# Patient Record
Sex: Female | Born: 1935 | Race: White | Hispanic: No | Smoking: Former smoker
Health system: Southern US, Community
[De-identification: ages and names within clinical notes are randomized; demographics above are authoritative.]

## PROBLEM LIST (undated history)

## (undated) DIAGNOSIS — H3530 Unspecified macular degeneration: Secondary | ICD-10-CM

## (undated) DIAGNOSIS — M109 Gout, unspecified: Secondary | ICD-10-CM

## (undated) DIAGNOSIS — F0390 Unspecified dementia without behavioral disturbance: Secondary | ICD-10-CM

## (undated) DIAGNOSIS — E785 Hyperlipidemia, unspecified: Secondary | ICD-10-CM

## (undated) DIAGNOSIS — K219 Gastro-esophageal reflux disease without esophagitis: Secondary | ICD-10-CM

## (undated) DIAGNOSIS — G4733 Obstructive sleep apnea (adult) (pediatric): Secondary | ICD-10-CM

## (undated) DIAGNOSIS — I252 Old myocardial infarction: Secondary | ICD-10-CM

## (undated) DIAGNOSIS — G510 Bell's palsy: Secondary | ICD-10-CM

## (undated) DIAGNOSIS — J449 Chronic obstructive pulmonary disease, unspecified: Secondary | ICD-10-CM

## (undated) DIAGNOSIS — F32A Depression, unspecified: Secondary | ICD-10-CM

## (undated) DIAGNOSIS — N189 Chronic kidney disease, unspecified: Secondary | ICD-10-CM

## (undated) DIAGNOSIS — I509 Heart failure, unspecified: Secondary | ICD-10-CM

## (undated) DIAGNOSIS — F329 Major depressive disorder, single episode, unspecified: Secondary | ICD-10-CM

## (undated) DIAGNOSIS — E119 Type 2 diabetes mellitus without complications: Secondary | ICD-10-CM

## (undated) DIAGNOSIS — E079 Disorder of thyroid, unspecified: Secondary | ICD-10-CM

---

## 2005-01-27 ENCOUNTER — Ambulatory Visit: Payer: Self-pay

## 2005-02-21 ENCOUNTER — Inpatient Hospital Stay: Payer: Self-pay | Admitting: Internal Medicine

## 2005-02-21 ENCOUNTER — Other Ambulatory Visit: Payer: Self-pay

## 2005-03-01 ENCOUNTER — Ambulatory Visit: Payer: Self-pay | Admitting: Internal Medicine

## 2005-03-08 ENCOUNTER — Ambulatory Visit: Payer: Self-pay | Admitting: Internal Medicine

## 2005-03-20 ENCOUNTER — Ambulatory Visit: Payer: Self-pay | Admitting: Internal Medicine

## 2005-04-19 ENCOUNTER — Ambulatory Visit: Payer: Self-pay | Admitting: Internal Medicine

## 2005-05-20 ENCOUNTER — Ambulatory Visit: Payer: Self-pay | Admitting: Internal Medicine

## 2006-12-11 ENCOUNTER — Inpatient Hospital Stay: Payer: Self-pay | Admitting: Internal Medicine

## 2006-12-11 ENCOUNTER — Other Ambulatory Visit: Payer: Self-pay

## 2006-12-23 ENCOUNTER — Other Ambulatory Visit: Payer: Self-pay

## 2006-12-23 ENCOUNTER — Emergency Department: Payer: Self-pay | Admitting: Emergency Medicine

## 2007-12-04 ENCOUNTER — Inpatient Hospital Stay: Payer: Self-pay | Admitting: Internal Medicine

## 2007-12-04 ENCOUNTER — Other Ambulatory Visit: Payer: Self-pay

## 2008-12-17 IMAGING — US US RENAL KIDNEY
1 series · 17 of 25 positions shown · non-contrast
Comparison: none

REASON FOR EXAM: Worsening renal insufficiency
COMMENTS:

[Series 1: us renal kidney · 17 of 33 slices shown]
[im 1/33]
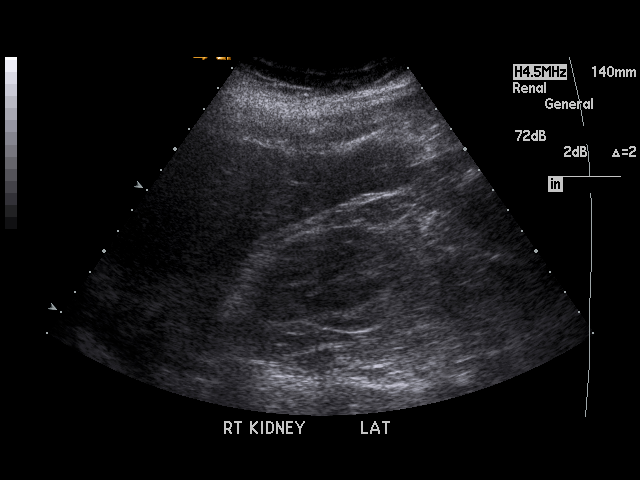
[im 3/33]
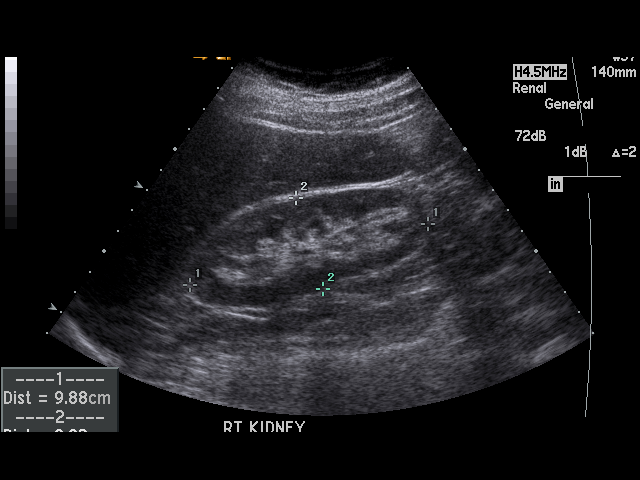
[im 5/33]
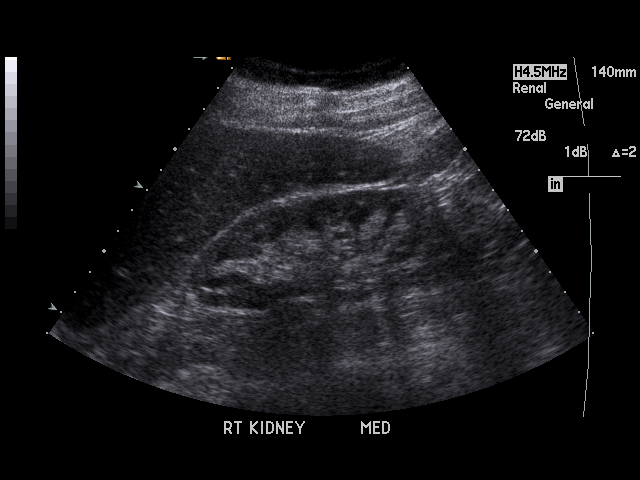
[im 7/33]
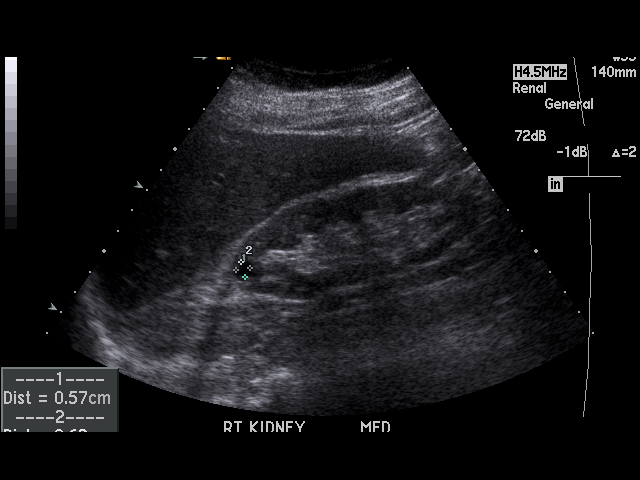
[im 9/33]
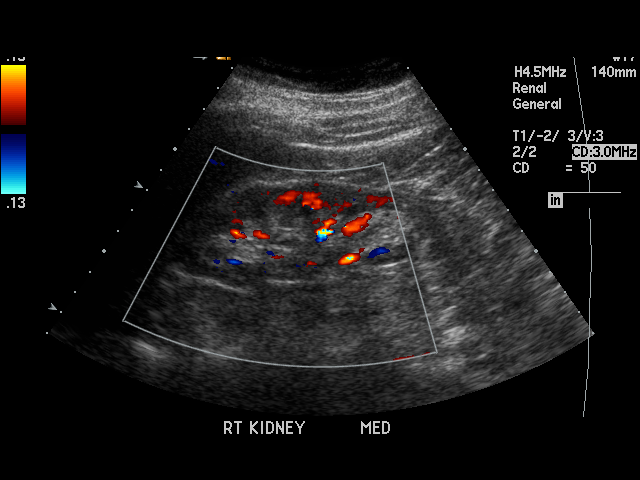
[im 11/33]
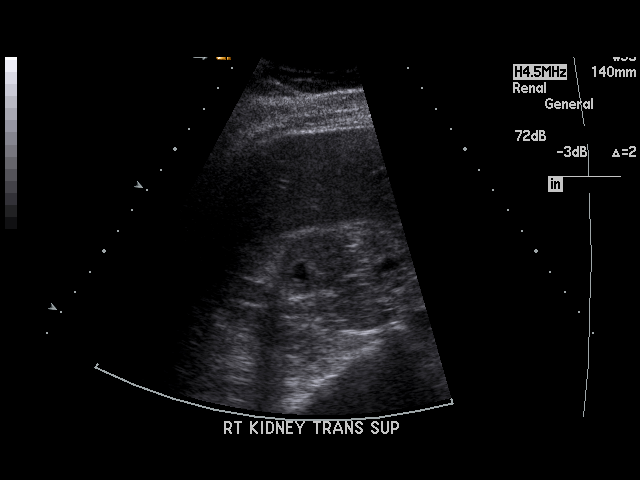
[im 13/33]
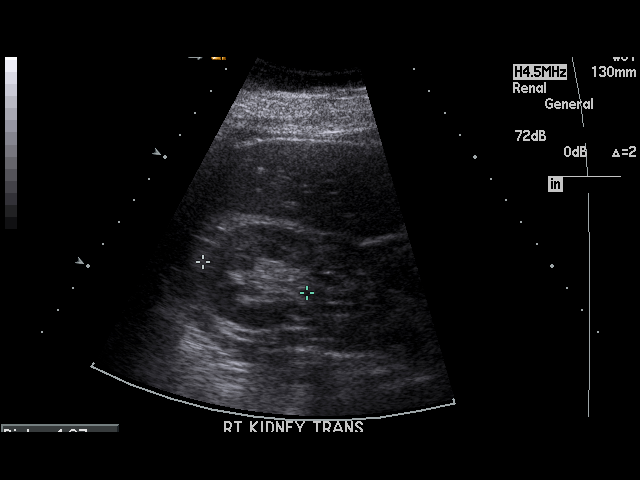
[im 15/33]
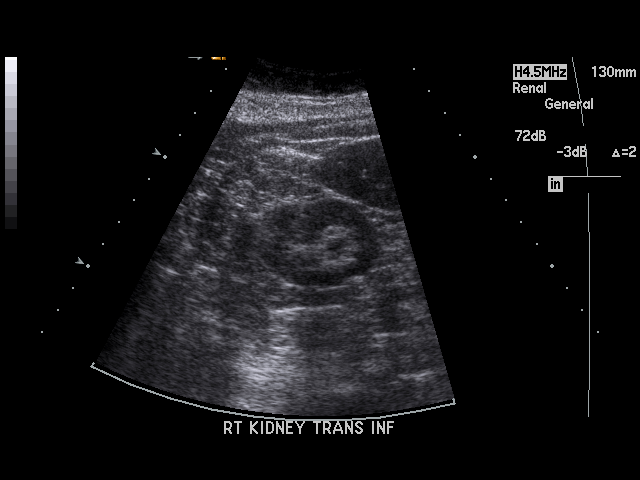
[im 17/33]
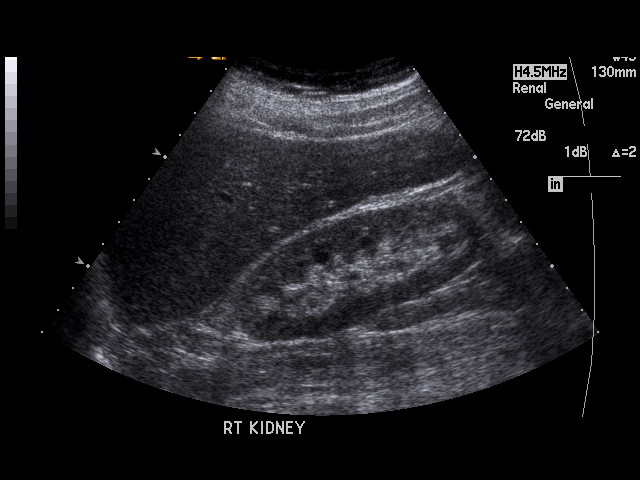
[im 18/33]
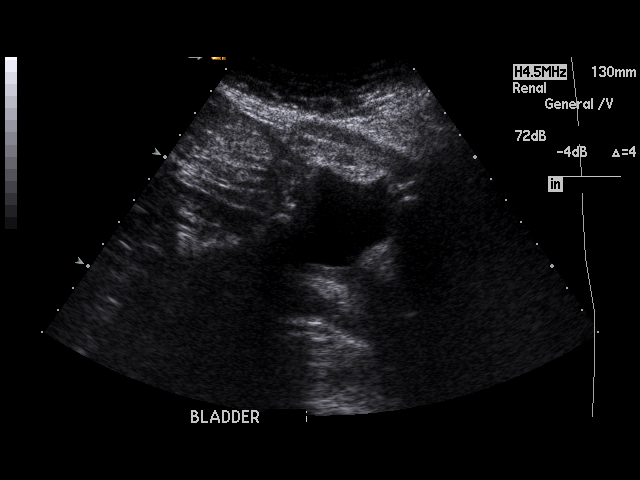
[im 21/33]
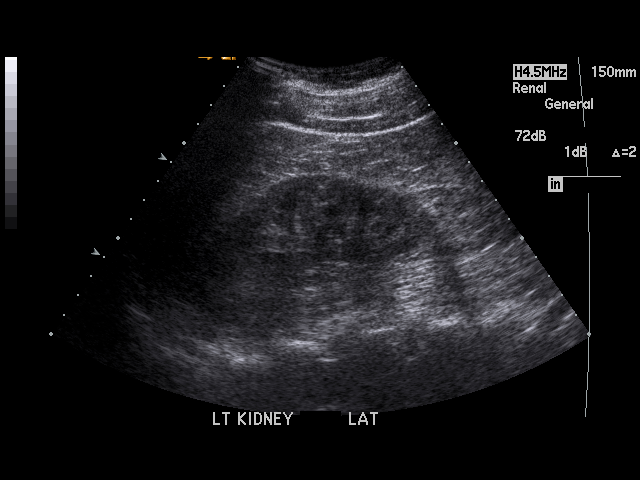
[im 22/33]
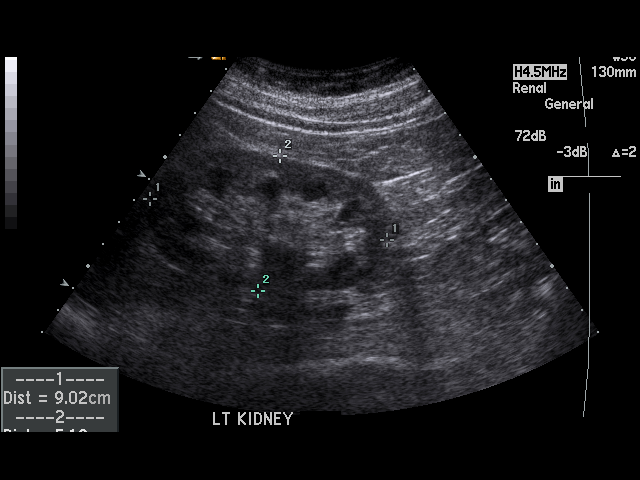
[im 25/33]
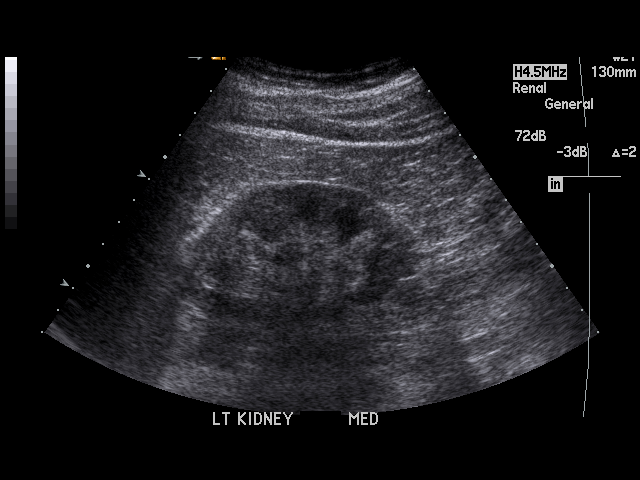
[im 26/33]
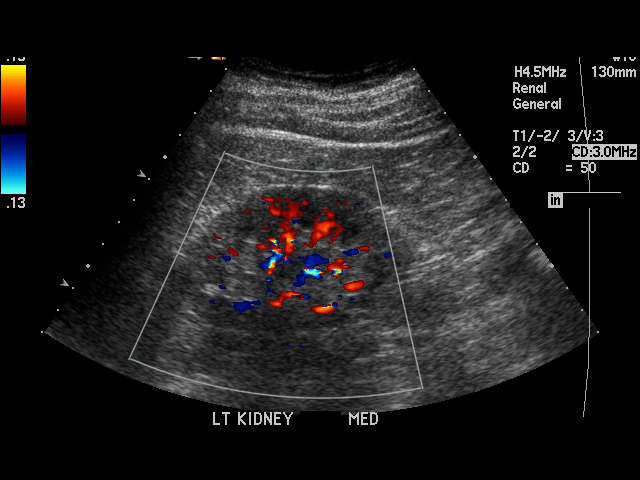
[im 29/33]
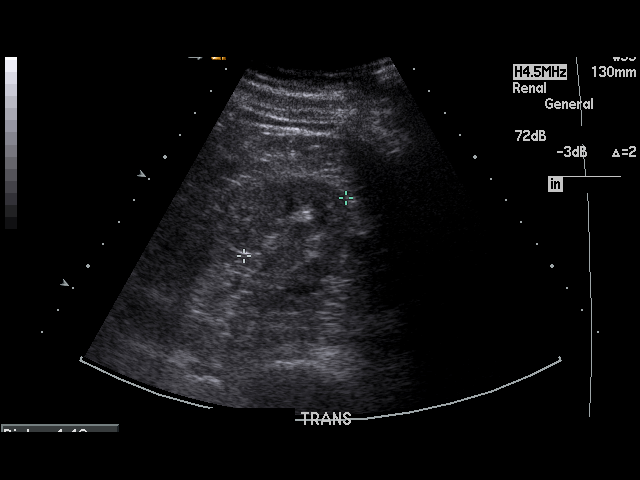
[im 30/33]
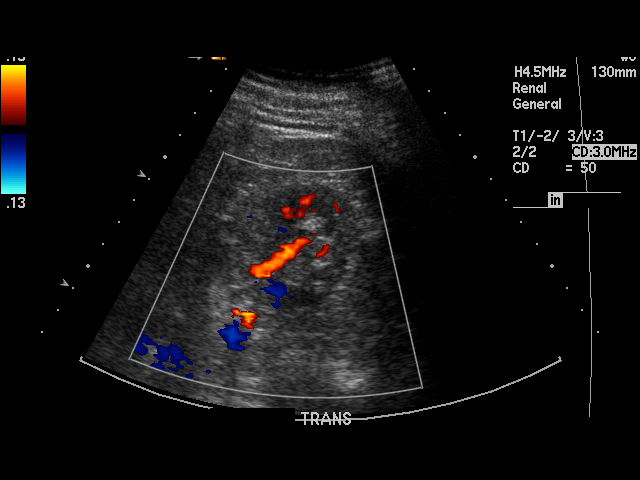
[im 33/33]
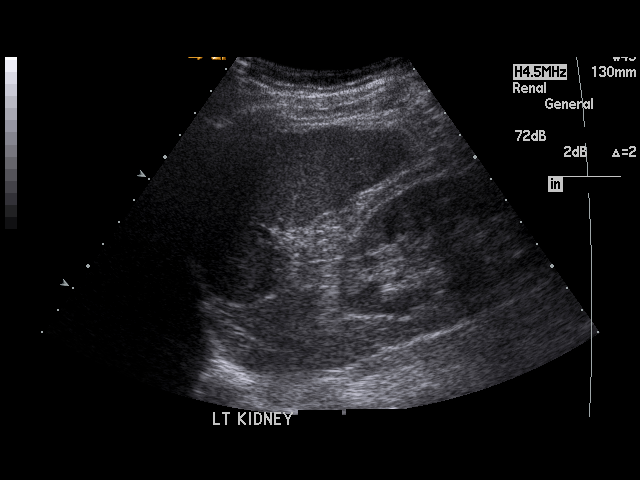

[17 of 25 positions shown; findings below may reference images not displayed]

PROCEDURE:     US  - US KIDNEY BILATERAL  - December 12, 2006  [DATE]

RESULT:     The RIGHT kidney measures 9.88 cm x 3.82 cm x 4.07 cm and the
LEFT kidney measures 9.02 cm x 5.13 cm x 4.40 cm.  There is a 6.2 mm cyst at
the upper pole of the RIGHT kidney.  No other solid or cystic renal mass
lesions are seen.  The renal cortical margins are smooth.  No renal
calcifications are identified.  There is no hydronephrosis.  The kidneys
show increased echogenicity compatible with a clinical history of renal
insufficiency.
IMPRESSION: No hydronephrosis or other acute changes are identified.

The kidneys show increased echogenicity compatible with a clinical history
of renal insufficiency.

## 2009-08-10 ENCOUNTER — Inpatient Hospital Stay: Payer: Self-pay | Admitting: Internal Medicine

## 2009-08-15 ENCOUNTER — Encounter: Payer: Self-pay | Admitting: Internal Medicine

## 2009-08-20 ENCOUNTER — Encounter: Payer: Self-pay | Admitting: Internal Medicine

## 2009-09-09 ENCOUNTER — Ambulatory Visit: Payer: Self-pay | Admitting: Internal Medicine

## 2009-09-19 ENCOUNTER — Encounter: Payer: Self-pay | Admitting: Internal Medicine

## 2010-12-29 ENCOUNTER — Emergency Department: Payer: Self-pay | Admitting: Emergency Medicine

## 2010-12-30 ENCOUNTER — Inpatient Hospital Stay: Payer: Self-pay | Admitting: Internal Medicine

## 2011-01-03 ENCOUNTER — Encounter: Payer: Self-pay | Admitting: Internal Medicine

## 2011-01-20 ENCOUNTER — Encounter: Payer: Self-pay | Admitting: Internal Medicine

## 2011-02-19 ENCOUNTER — Ambulatory Visit: Payer: Self-pay | Admitting: Internal Medicine

## 2011-02-20 ENCOUNTER — Encounter: Payer: Self-pay | Admitting: Internal Medicine

## 2011-03-16 ENCOUNTER — Emergency Department: Payer: Self-pay | Admitting: Emergency Medicine

## 2013-01-20 ENCOUNTER — Ambulatory Visit: Payer: Self-pay | Admitting: Internal Medicine

## 2013-01-24 ENCOUNTER — Inpatient Hospital Stay: Payer: Self-pay | Admitting: Internal Medicine

## 2013-01-24 LAB — IRON AND TIBC
Iron Bind.Cap.(Total): 463 ug/dL — ABNORMAL HIGH (ref 250–450)
Iron Saturation: 9 %
Iron: 43 ug/dL — ABNORMAL LOW (ref 50–170)
Unbound Iron-Bind.Cap.: 420 ug/dL

## 2013-01-24 LAB — CBC
HGB: 8.9 g/dL — ABNORMAL LOW (ref 12.0–16.0)
MCV: 75 fL — ABNORMAL LOW (ref 80–100)
Platelet: 342 10*3/uL (ref 150–440)
RBC: 3.76 10*6/uL — ABNORMAL LOW (ref 3.80–5.20)
RDW: 18.4 % — ABNORMAL HIGH (ref 11.5–14.5)

## 2013-01-24 LAB — FERRITIN: Ferritin (ARMC): 10 ng/mL (ref 8–388)

## 2013-01-24 LAB — BASIC METABOLIC PANEL
BUN: 58 mg/dL — ABNORMAL HIGH (ref 7–18)
Calcium, Total: 8.7 mg/dL (ref 8.5–10.1)
Chloride: 107 mmol/L (ref 98–107)
EGFR (Non-African Amer.): 18 — ABNORMAL LOW
Osmolality: 296 (ref 275–301)
Potassium: 4.3 mmol/L (ref 3.5–5.1)
Sodium: 141 mmol/L (ref 136–145)

## 2013-01-24 LAB — URINALYSIS, COMPLETE
Bilirubin,UR: NEGATIVE
Blood: NEGATIVE
Glucose,UR: NEGATIVE mg/dL (ref 0–75)
Hyaline Cast: 4
Ketone: NEGATIVE
Leukocyte Esterase: NEGATIVE
Nitrite: NEGATIVE
Ph: 5 (ref 4.5–8.0)
Protein: NEGATIVE
RBC,UR: <1 /HPF (ref 0–5)
WBC UR: 1 /HPF (ref 0–5)

## 2013-01-24 LAB — PRO B NATRIURETIC PEPTIDE: B-Type Natriuretic Peptide: 2248 pg/mL — ABNORMAL HIGH (ref 0–450)

## 2013-01-25 LAB — CBC WITH DIFFERENTIAL/PLATELET
Basophil #: 0.1 10*3/uL (ref 0.0–0.1)
Basophil %: 0.7 %
Eosinophil #: 0.1 10*3/uL (ref 0.0–0.7)
Eosinophil %: 0.9 %
HGB: 8.4 g/dL — ABNORMAL LOW (ref 12.0–16.0)
MCV: 75 fL — ABNORMAL LOW (ref 80–100)
Monocyte #: 0.8 x10 3/mm (ref 0.2–0.9)
Neutrophil #: 7.4 10*3/uL — ABNORMAL HIGH (ref 1.4–6.5)
Neutrophil %: 80.3 %
Platelet: 291 10*3/uL (ref 150–440)
RDW: 18.3 % — ABNORMAL HIGH (ref 11.5–14.5)

## 2013-01-25 LAB — TROPONIN I
Troponin-I: 0.06 ng/mL — ABNORMAL HIGH
Troponin-I: 0.06 ng/mL — ABNORMAL HIGH

## 2013-01-25 LAB — CK TOTAL AND CKMB (NOT AT ARMC): CK, Total: 318 U/L — ABNORMAL HIGH (ref 21–215)

## 2013-01-25 LAB — MAGNESIUM: Magnesium: 2.1 mg/dL

## 2013-01-26 LAB — CBC WITH DIFFERENTIAL/PLATELET
Basophil #: 0.1 10*3/uL (ref 0.0–0.1)
HCT: 29.4 % — ABNORMAL LOW (ref 35.0–47.0)
HGB: 9.5 g/dL — ABNORMAL LOW (ref 12.0–16.0)
Lymphocyte #: 1.9 10*3/uL (ref 1.0–3.6)
Lymphocyte %: 22.8 %
MCHC: 32.2 g/dL (ref 32.0–36.0)
Monocyte #: 0.9 x10 3/mm (ref 0.2–0.9)
Neutrophil #: 5.1 10*3/uL (ref 1.4–6.5)
Neutrophil %: 61.3 %
RBC: 3.86 10*6/uL (ref 3.80–5.20)

## 2013-01-26 LAB — BASIC METABOLIC PANEL
Anion Gap: 6 — ABNORMAL LOW (ref 7–16)
Calcium, Total: 8.6 mg/dL (ref 8.5–10.1)
Chloride: 109 mmol/L — ABNORMAL HIGH (ref 98–107)
Co2: 27 mmol/L (ref 21–32)
EGFR (Non-African Amer.): 19 — ABNORMAL LOW
Osmolality: 296 (ref 275–301)
Sodium: 142 mmol/L (ref 136–145)

## 2013-01-26 LAB — TROPONIN I: Troponin-I: 0.08 ng/mL — ABNORMAL HIGH

## 2013-01-27 LAB — BASIC METABOLIC PANEL
Anion Gap: 7 (ref 7–16)
BUN: 46 mg/dL — ABNORMAL HIGH (ref 7–18)
Calcium, Total: 8.4 mg/dL — ABNORMAL LOW (ref 8.5–10.1)
Co2: 28 mmol/L (ref 21–32)
EGFR (Non-African Amer.): 20 — ABNORMAL LOW
Glucose: 129 mg/dL — ABNORMAL HIGH (ref 65–99)
Potassium: 4.7 mmol/L (ref 3.5–5.1)

## 2013-01-27 LAB — URIC ACID: Uric Acid: 9.7 mg/dL — ABNORMAL HIGH (ref 2.6–6.0)

## 2013-01-28 LAB — CBC WITH DIFFERENTIAL/PLATELET
Basophil #: 0 10*3/uL (ref 0.0–0.1)
Basophil %: 0.4 %
Eosinophil #: 0 10*3/uL (ref 0.0–0.7)
Eosinophil %: 0.3 %
MCH: 24.7 pg — ABNORMAL LOW (ref 26.0–34.0)
MCHC: 32.3 g/dL (ref 32.0–36.0)
MCV: 77 fL — ABNORMAL LOW (ref 80–100)
Monocyte #: 0.8 x10 3/mm (ref 0.2–0.9)
Neutrophil #: 8.1 10*3/uL — ABNORMAL HIGH (ref 1.4–6.5)
RBC: 3.61 10*6/uL — ABNORMAL LOW (ref 3.80–5.20)
RDW: 17.9 % — ABNORMAL HIGH (ref 11.5–14.5)
WBC: 10.2 10*3/uL (ref 3.6–11.0)

## 2013-01-29 LAB — BASIC METABOLIC PANEL
Anion Gap: 8 (ref 7–16)
BUN: 54 mg/dL — ABNORMAL HIGH (ref 7–18)
Calcium, Total: 8.5 mg/dL (ref 8.5–10.1)
Chloride: 105 mmol/L (ref 98–107)
Creatinine: 2.29 mg/dL — ABNORMAL HIGH (ref 0.60–1.30)
EGFR (African American): 21 — ABNORMAL LOW
EGFR (Non-African Amer.): 18 — ABNORMAL LOW
Glucose: 99 mg/dL (ref 65–99)
Osmolality: 294 (ref 275–301)
Potassium: 4.2 mmol/L (ref 3.5–5.1)
Sodium: 140 mmol/L (ref 136–145)

## 2013-01-29 LAB — HEMATOCRIT: HCT: 27.3 % — ABNORMAL LOW (ref 35.0–47.0)

## 2013-01-30 LAB — HEMATOCRIT: HCT: 27.7 % — ABNORMAL LOW (ref 35.0–47.0)

## 2013-01-30 LAB — PATHOLOGY REPORT

## 2013-01-31 ENCOUNTER — Encounter: Payer: Self-pay | Admitting: Internal Medicine

## 2013-02-17 ENCOUNTER — Encounter: Payer: Self-pay | Admitting: Internal Medicine

## 2013-02-17 ENCOUNTER — Ambulatory Visit: Payer: Self-pay | Admitting: Internal Medicine

## 2013-02-21 LAB — CLOSTRIDIUM DIFFICILE BY PCR

## 2013-04-12 LAB — CBC
HCT: 35.3 % (ref 35.0–47.0)
HGB: 11.2 g/dL — ABNORMAL LOW (ref 12.0–16.0)
MCH: 26.4 pg (ref 26.0–34.0)
Platelet: 309 10*3/uL (ref 150–440)
RBC: 4.25 10*6/uL (ref 3.80–5.20)
RDW: 22.1 % — ABNORMAL HIGH (ref 11.5–14.5)
WBC: 9.9 10*3/uL (ref 3.6–11.0)

## 2013-04-12 LAB — BASIC METABOLIC PANEL
Anion Gap: 5 — ABNORMAL LOW (ref 7–16)
BUN: 39 mg/dL — ABNORMAL HIGH (ref 7–18)
EGFR (Non-African Amer.): 25 — ABNORMAL LOW
Glucose: 147 mg/dL — ABNORMAL HIGH (ref 65–99)
Sodium: 140 mmol/L (ref 136–145)

## 2013-04-12 LAB — TROPONIN I: Troponin-I: <0.02 ng/mL

## 2013-04-13 ENCOUNTER — Inpatient Hospital Stay: Payer: Self-pay | Admitting: Internal Medicine

## 2013-04-13 LAB — URIC ACID: Uric Acid: 7 mg/dL — ABNORMAL HIGH (ref 2.6–6.0)

## 2013-04-14 LAB — BASIC METABOLIC PANEL
BUN: 37 mg/dL — ABNORMAL HIGH (ref 7–18)
Calcium, Total: 7.9 mg/dL — ABNORMAL LOW (ref 8.5–10.1)
Chloride: 104 mmol/L (ref 98–107)
Creatinine: 2.13 mg/dL — ABNORMAL HIGH (ref 0.60–1.30)
EGFR (African American): 23 — ABNORMAL LOW
Glucose: 104 mg/dL — ABNORMAL HIGH (ref 65–99)
Osmolality: 294 (ref 275–301)
Potassium: 3.8 mmol/L (ref 3.5–5.1)
Sodium: 143 mmol/L (ref 136–145)

## 2013-04-16 LAB — CBC WITH DIFFERENTIAL/PLATELET
Basophil #: 0 x10 3/mm 3
Basophil %: 0.1 %
Eosinophil #: 0 x10 3/mm 3
Eosinophil %: 0 %
HCT: 32.5 % — ABNORMAL LOW
HGB: 10.5 g/dL — ABNORMAL LOW
Lymphocyte %: 3.6 %
Lymphs Abs: 0.5 x10 3/mm 3 — ABNORMAL LOW
MCH: 26.3 pg
MCHC: 32.3 g/dL
MCV: 81 fL
Monocyte #: 0.3 "x10 3/mm "
Monocyte %: 2.1 %
Neutrophil #: 12.1 x10 3/mm 3 — ABNORMAL HIGH
Neutrophil %: 94.2 %
RBC: 3.99 X10 6/mm 3
RDW: 21.9 % — ABNORMAL HIGH
WBC: 12.8 x10 3/mm 3 — ABNORMAL HIGH

## 2013-04-16 LAB — BASIC METABOLIC PANEL
BUN: 60 mg/dL — ABNORMAL HIGH (ref 7–18)
Calcium, Total: 8.5 mg/dL (ref 8.5–10.1)
Chloride: 101 mmol/L (ref 98–107)
Creatinine: 2.03 mg/dL — ABNORMAL HIGH (ref 0.60–1.30)
EGFR (African American): 25 — ABNORMAL LOW
EGFR (Non-African Amer.): 21 — ABNORMAL LOW
Potassium: 4.3 mmol/L (ref 3.5–5.1)

## 2013-04-16 LAB — PLATELET COUNT: Platelet: 315 x10 3/mm 3

## 2013-04-17 LAB — BASIC METABOLIC PANEL
Calcium, Total: 8.4 mg/dL — ABNORMAL LOW (ref 8.5–10.1)
Creatinine: 2.28 mg/dL — ABNORMAL HIGH (ref 0.60–1.30)
EGFR (Non-African Amer.): 18 — ABNORMAL LOW
Glucose: 134 mg/dL — ABNORMAL HIGH (ref 65–99)
Osmolality: 307 (ref 275–301)
Potassium: 3.8 mmol/L (ref 3.5–5.1)

## 2013-04-17 LAB — CBC WITH DIFFERENTIAL/PLATELET
Basophil #: 0 10*3/uL (ref 0.0–0.1)
Basophil %: 0.1 %
Eosinophil #: 0 10*3/uL (ref 0.0–0.7)
HCT: 33.6 % — ABNORMAL LOW (ref 35.0–47.0)
HGB: 10.9 g/dL — ABNORMAL LOW (ref 12.0–16.0)
Lymphocyte #: 0.9 10*3/uL — ABNORMAL LOW (ref 1.0–3.6)
Lymphocyte %: 6.7 %
MCH: 26.7 pg (ref 26.0–34.0)
MCHC: 32.5 g/dL (ref 32.0–36.0)
MCV: 82 fL (ref 80–100)
Monocyte %: 5.7 %
Neutrophil %: 87.4 %
Platelet: 308 10*3/uL (ref 150–440)
RDW: 21.4 % — ABNORMAL HIGH (ref 11.5–14.5)
WBC: 13.3 10*3/uL — ABNORMAL HIGH (ref 3.6–11.0)

## 2013-04-18 ENCOUNTER — Encounter: Payer: Self-pay | Admitting: Internal Medicine

## 2013-04-18 LAB — BASIC METABOLIC PANEL
Anion Gap: 4 — ABNORMAL LOW (ref 7–16)
BUN: 66 mg/dL — ABNORMAL HIGH (ref 7–18)
Calcium, Total: 8.1 mg/dL — ABNORMAL LOW (ref 8.5–10.1)
Chloride: 106 mmol/L (ref 98–107)
Creatinine: 2.13 mg/dL — ABNORMAL HIGH (ref 0.60–1.30)
EGFR (Non-African Amer.): 20 — ABNORMAL LOW
Glucose: 208 mg/dL — ABNORMAL HIGH (ref 65–99)
Osmolality: 310 (ref 275–301)
Sodium: 143 mmol/L (ref 136–145)

## 2013-04-19 ENCOUNTER — Encounter: Payer: Self-pay | Admitting: Internal Medicine

## 2013-04-26 LAB — BASIC METABOLIC PANEL
Calcium, Total: 8.3 mg/dL — ABNORMAL LOW (ref 8.5–10.1)
Creatinine: 1.82 mg/dL — ABNORMAL HIGH (ref 0.60–1.30)
Potassium: 4.4 mmol/L (ref 3.5–5.1)

## 2013-04-30 LAB — BASIC METABOLIC PANEL
Co2: 28 mmol/L (ref 21–32)
Creatinine: 2.64 mg/dL — ABNORMAL HIGH (ref 0.60–1.30)
EGFR (African American): 18 — ABNORMAL LOW
EGFR (Non-African Amer.): 15 — ABNORMAL LOW
Glucose: 170 mg/dL — ABNORMAL HIGH (ref 65–99)
Osmolality: 295 (ref 275–301)
Sodium: 137 mmol/L (ref 136–145)

## 2013-05-10 LAB — BASIC METABOLIC PANEL
Calcium, Total: 9.1 mg/dL (ref 8.5–10.1)
EGFR (African American): 25 — ABNORMAL LOW
Glucose: 105 mg/dL — ABNORMAL HIGH (ref 65–99)
Osmolality: 293 (ref 275–301)
Potassium: 4.2 mmol/L (ref 3.5–5.1)

## 2013-05-20 ENCOUNTER — Encounter: Payer: Self-pay | Admitting: Internal Medicine

## 2013-06-19 ENCOUNTER — Encounter: Payer: Self-pay | Admitting: Internal Medicine

## 2013-06-19 ENCOUNTER — Ambulatory Visit: Payer: Self-pay | Admitting: Internal Medicine

## 2013-07-10 ENCOUNTER — Observation Stay: Payer: Self-pay | Admitting: Internal Medicine

## 2013-07-10 LAB — TROPONIN I
Troponin-I: 0.05 ng/mL
Troponin-I: 6.1 ng/mL — ABNORMAL HIGH

## 2013-07-10 LAB — CBC WITH DIFFERENTIAL/PLATELET
Basophil %: 1.1 %
Eosinophil %: 2.6 %
HGB: 10.8 g/dL — ABNORMAL LOW (ref 12.0–16.0)
MCV: 88 fL (ref 80–100)
RBC: 3.75 10*6/uL — ABNORMAL LOW (ref 3.80–5.20)
RDW: 18.4 % — ABNORMAL HIGH (ref 11.5–14.5)
WBC: 12.2 10*3/uL — ABNORMAL HIGH (ref 3.6–11.0)

## 2013-07-10 LAB — URINALYSIS, COMPLETE
Bilirubin,UR: NEGATIVE
Hyaline Cast: 3
Protein: NEGATIVE
Specific Gravity: 1.01 (ref 1.003–1.030)
Squamous Epithelial: 3

## 2013-07-10 LAB — CK TOTAL AND CKMB (NOT AT ARMC)
CK, Total: 175 U/L (ref 21–215)
CK, Total: 172 U/L (ref 21–215)
CK-MB: 1.9 ng/mL (ref 0.5–3.6)

## 2013-07-10 LAB — COMPREHENSIVE METABOLIC PANEL
Albumin: 3.6 g/dL (ref 3.4–5.0)
BUN: 58 mg/dL — ABNORMAL HIGH (ref 7–18)
Bilirubin,Total: 0.2 mg/dL (ref 0.2–1.0)
Calcium, Total: 9.1 mg/dL (ref 8.5–10.1)
Chloride: 102 mmol/L (ref 98–107)
Creatinine: 2.47 mg/dL — ABNORMAL HIGH (ref 0.60–1.30)
EGFR (African American): 19 — ABNORMAL LOW
Glucose: 274 mg/dL — ABNORMAL HIGH (ref 65–99)
Osmolality: 300 (ref 275–301)
SGOT(AST): 19 U/L (ref 15–37)
SGPT (ALT): 16 U/L (ref 12–78)
Sodium: 137 mmol/L (ref 136–145)

## 2013-07-11 LAB — BASIC METABOLIC PANEL
Anion Gap: 1 — ABNORMAL LOW (ref 7–16)
BUN: 50 mg/dL — ABNORMAL HIGH (ref 7–18)
Calcium, Total: 8.8 mg/dL (ref 8.5–10.1)
Chloride: 107 mmol/L (ref 98–107)
Co2: 33 mmol/L — ABNORMAL HIGH (ref 21–32)
Creatinine: 1.83 mg/dL — ABNORMAL HIGH (ref 0.60–1.30)
EGFR (African American): 28 — ABNORMAL LOW
EGFR (Non-African Amer.): 24 — ABNORMAL LOW
Glucose: 103 mg/dL — ABNORMAL HIGH (ref 65–99)
Osmolality: 295 (ref 275–301)
Sodium: 141 mmol/L (ref 136–145)

## 2013-07-11 LAB — CBC WITH DIFFERENTIAL/PLATELET
Basophil #: 0.1 10*3/uL (ref 0.0–0.1)
Basophil %: 1.2 %
Eosinophil #: 0.5 10*3/uL (ref 0.0–0.7)
MCHC: 33 g/dL (ref 32.0–36.0)
MCV: 88 fL (ref 80–100)
Monocyte %: 10.9 %
Neutrophil #: 5.8 10*3/uL (ref 1.4–6.5)
Platelet: 226 10*3/uL (ref 150–440)
RBC: 3.5 10*6/uL — ABNORMAL LOW (ref 3.80–5.20)

## 2013-07-11 LAB — LIPID PANEL
Cholesterol: 145 mg/dL (ref 0–200)
HDL Cholesterol: 67 mg/dL — ABNORMAL HIGH (ref 40–60)
VLDL Cholesterol, Calc: 13 mg/dL (ref 5–40)

## 2013-07-20 ENCOUNTER — Encounter: Payer: Self-pay | Admitting: Internal Medicine

## 2013-07-20 ENCOUNTER — Ambulatory Visit: Payer: Self-pay | Admitting: Internal Medicine

## 2013-08-20 ENCOUNTER — Encounter: Payer: Self-pay | Admitting: Internal Medicine

## 2013-09-04 LAB — BASIC METABOLIC PANEL
Anion Gap: 5 — ABNORMAL LOW (ref 7–16)
Calcium, Total: 8.3 mg/dL — ABNORMAL LOW (ref 8.5–10.1)
Chloride: 105 mmol/L (ref 98–107)
Co2: 32 mmol/L (ref 21–32)
Creatinine: 1.97 mg/dL — ABNORMAL HIGH (ref 0.60–1.30)
EGFR (Non-African Amer.): 22 — ABNORMAL LOW
Potassium: 4.3 mmol/L (ref 3.5–5.1)
Sodium: 142 mmol/L (ref 136–145)

## 2013-09-19 ENCOUNTER — Encounter: Payer: Self-pay | Admitting: Internal Medicine

## 2013-09-20 LAB — BASIC METABOLIC PANEL
Anion Gap: 3 — ABNORMAL LOW (ref 7–16)
BUN: 58 mg/dL — ABNORMAL HIGH (ref 7–18)
Calcium, Total: 9.2 mg/dL (ref 8.5–10.1)
Co2: 36 mmol/L — ABNORMAL HIGH (ref 21–32)
Creatinine: 2.34 mg/dL — ABNORMAL HIGH (ref 0.60–1.30)
EGFR (Non-African Amer.): 18 — ABNORMAL LOW
Osmolality: 294 (ref 275–301)
Sodium: 139 mmol/L (ref 136–145)

## 2013-09-25 LAB — BASIC METABOLIC PANEL
Calcium, Total: 9.3 mg/dL (ref 8.5–10.1)
Chloride: 102 mmol/L (ref 98–107)
Co2: 33 mmol/L — ABNORMAL HIGH (ref 21–32)
EGFR (African American): 17 — ABNORMAL LOW
EGFR (Non-African Amer.): 15 — ABNORMAL LOW
Glucose: 112 mg/dL — ABNORMAL HIGH (ref 65–99)
Sodium: 138 mmol/L (ref 136–145)

## 2013-10-20 ENCOUNTER — Encounter: Payer: Self-pay | Admitting: Internal Medicine

## 2013-11-19 ENCOUNTER — Encounter: Payer: Self-pay | Admitting: Internal Medicine

## 2013-12-20 ENCOUNTER — Encounter: Payer: Self-pay | Admitting: Internal Medicine

## 2014-01-20 ENCOUNTER — Encounter: Payer: Self-pay | Admitting: Internal Medicine

## 2014-01-29 LAB — TSH: THYROID STIMULATING HORM: 3.53 u[IU]/mL

## 2014-01-29 LAB — BASIC METABOLIC PANEL
Anion Gap: 3 — ABNORMAL LOW (ref 7–16)
BUN: 85 mg/dL — ABNORMAL HIGH (ref 7–18)
CO2: 28 mmol/L (ref 21–32)
Calcium, Total: 9.4 mg/dL (ref 8.5–10.1)
Chloride: 107 mmol/L (ref 98–107)
Creatinine: 2.48 mg/dL — ABNORMAL HIGH (ref 0.60–1.30)
GFR CALC AF AMER: 19 — AB
GFR CALC NON AF AMER: 17 — AB
Glucose: 165 mg/dL — ABNORMAL HIGH (ref 65–99)
OSMOLALITY: 305 (ref 275–301)
POTASSIUM: 4.6 mmol/L (ref 3.5–5.1)
Sodium: 138 mmol/L (ref 136–145)

## 2014-01-29 LAB — CBC WITH DIFFERENTIAL/PLATELET
BASOS PCT: 1 %
Basophil #: 0.1 10*3/uL (ref 0.0–0.1)
Eosinophil #: 0.4 10*3/uL (ref 0.0–0.7)
Eosinophil %: 4.5 %
HCT: 33.7 % — AB (ref 35.0–47.0)
HGB: 11.1 g/dL — AB (ref 12.0–16.0)
LYMPHS ABS: 2.2 10*3/uL (ref 1.0–3.6)
Lymphocyte %: 23.5 %
MCH: 30.7 pg (ref 26.0–34.0)
MCHC: 32.9 g/dL (ref 32.0–36.0)
MCV: 93 fL (ref 80–100)
MONOS PCT: 8.8 %
Monocyte #: 0.8 x10 3/mm (ref 0.2–0.9)
NEUTROS ABS: 5.9 10*3/uL (ref 1.4–6.5)
Neutrophil %: 62.2 %
PLATELETS: 240 10*3/uL (ref 150–440)
RBC: 3.62 10*6/uL — AB (ref 3.80–5.20)
RDW: 18.4 % — AB (ref 11.5–14.5)
WBC: 9.5 10*3/uL (ref 3.6–11.0)

## 2014-01-29 LAB — HEPATIC FUNCTION PANEL A (ARMC)
ALK PHOS: 103 U/L
Albumin: 3.5 g/dL (ref 3.4–5.0)
BILIRUBIN TOTAL: 0.5 mg/dL (ref 0.2–1.0)
Bilirubin, Direct: 0.1 mg/dL (ref 0.00–0.20)
SGOT(AST): 28 U/L (ref 15–37)
SGPT (ALT): 23 U/L (ref 12–78)
Total Protein: 7.2 g/dL (ref 6.4–8.2)

## 2014-01-29 LAB — LIPID PANEL
Cholesterol: 151 mg/dL (ref 0–200)
HDL: 76 mg/dL — AB (ref 40–60)
Ldl Cholesterol, Calc: 63 mg/dL (ref 0–100)
TRIGLYCERIDES: 61 mg/dL (ref 0–200)
VLDL Cholesterol, Calc: 12 mg/dL (ref 5–40)

## 2014-01-29 LAB — MAGNESIUM: Magnesium: 1.6 mg/dL — ABNORMAL LOW

## 2014-02-17 ENCOUNTER — Encounter: Payer: Self-pay | Admitting: Internal Medicine

## 2014-03-20 ENCOUNTER — Encounter: Payer: Self-pay | Admitting: Internal Medicine

## 2014-04-19 ENCOUNTER — Encounter: Payer: Self-pay | Admitting: Internal Medicine

## 2014-05-20 ENCOUNTER — Encounter: Payer: Self-pay | Admitting: Internal Medicine

## 2014-06-19 ENCOUNTER — Encounter: Payer: Self-pay | Admitting: Internal Medicine

## 2014-06-21 LAB — CBC WITH DIFFERENTIAL/PLATELET
BASOS ABS: 0.1 10*3/uL (ref 0.0–0.1)
BASOS PCT: 1.3 %
EOS ABS: 0.6 10*3/uL (ref 0.0–0.7)
EOS PCT: 5.6 %
HCT: 28.5 % — AB (ref 35.0–47.0)
HGB: 9.3 g/dL — AB (ref 12.0–16.0)
LYMPHS ABS: 2.2 10*3/uL (ref 1.0–3.6)
Lymphocyte %: 20.2 %
MCH: 31.4 pg (ref 26.0–34.0)
MCHC: 32.7 g/dL (ref 32.0–36.0)
MCV: 96 fL (ref 80–100)
MONO ABS: 1 x10 3/mm — AB (ref 0.2–0.9)
MONOS PCT: 9 %
NEUTROS ABS: 6.9 10*3/uL — AB (ref 1.4–6.5)
Neutrophil %: 63.9 %
Platelet: 251 10*3/uL (ref 150–440)
RBC: 2.96 10*6/uL — ABNORMAL LOW (ref 3.80–5.20)
RDW: 15.1 % — ABNORMAL HIGH (ref 11.5–14.5)
WBC: 10.8 10*3/uL (ref 3.6–11.0)

## 2014-06-21 LAB — BASIC METABOLIC PANEL
ANION GAP: 6 — AB (ref 7–16)
BUN: 80 mg/dL — ABNORMAL HIGH (ref 7–18)
CREATININE: 3.14 mg/dL — AB (ref 0.60–1.30)
Calcium, Total: 8.5 mg/dL (ref 8.5–10.1)
Chloride: 103 mmol/L (ref 98–107)
Co2: 30 mmol/L (ref 21–32)
EGFR (Non-African Amer.): 12 — ABNORMAL LOW
GFR CALC AF AMER: 14 — AB
GLUCOSE: 109 mg/dL — AB (ref 65–99)
Osmolality: 302 (ref 275–301)
POTASSIUM: 5.6 mmol/L — AB (ref 3.5–5.1)
SODIUM: 139 mmol/L (ref 136–145)

## 2014-06-24 LAB — COMPREHENSIVE METABOLIC PANEL
ALK PHOS: 127 U/L — AB
Albumin: 3.4 g/dL (ref 3.4–5.0)
Anion Gap: 4 — ABNORMAL LOW (ref 7–16)
BUN: 62 mg/dL — AB (ref 7–18)
Bilirubin,Total: 0.3 mg/dL (ref 0.2–1.0)
CALCIUM: 8.4 mg/dL — AB (ref 8.5–10.1)
CO2: 30 mmol/L (ref 21–32)
CREATININE: 2.73 mg/dL — AB (ref 0.60–1.30)
Chloride: 104 mmol/L (ref 98–107)
EGFR (Non-African Amer.): 15 — ABNORMAL LOW
GFR CALC AF AMER: 17 — AB
Glucose: 211 mg/dL — ABNORMAL HIGH (ref 65–99)
Osmolality: 300 (ref 275–301)
Potassium: 4.9 mmol/L (ref 3.5–5.1)
SGOT(AST): 27 U/L (ref 15–37)
SGPT (ALT): 21 U/L (ref 12–78)
Sodium: 138 mmol/L (ref 136–145)
TOTAL PROTEIN: 7.1 g/dL (ref 6.4–8.2)

## 2014-07-02 LAB — BASIC METABOLIC PANEL
Anion Gap: 4 — ABNORMAL LOW (ref 7–16)
BUN: 43 mg/dL — AB (ref 7–18)
Calcium, Total: 8.8 mg/dL (ref 8.5–10.1)
Chloride: 105 mmol/L (ref 98–107)
Co2: 32 mmol/L (ref 21–32)
Creatinine: 2.31 mg/dL — ABNORMAL HIGH (ref 0.60–1.30)
EGFR (African American): 21 — ABNORMAL LOW
GFR CALC NON AF AMER: 18 — AB
GLUCOSE: 90 mg/dL (ref 65–99)
Osmolality: 292 (ref 275–301)
POTASSIUM: 4.9 mmol/L (ref 3.5–5.1)
SODIUM: 141 mmol/L (ref 136–145)

## 2014-07-02 LAB — HEMOGLOBIN A1C: Hemoglobin A1C: 7.4 % — ABNORMAL HIGH (ref 4.2–6.3)

## 2014-07-02 LAB — MAGNESIUM: MAGNESIUM: 2 mg/dL

## 2014-07-05 ENCOUNTER — Inpatient Hospital Stay: Payer: Self-pay | Admitting: Internal Medicine

## 2014-07-05 LAB — CBC
HCT: 30 % — ABNORMAL LOW (ref 35.0–47.0)
HGB: 9.4 g/dL — ABNORMAL LOW (ref 12.0–16.0)
MCH: 31.1 pg (ref 26.0–34.0)
MCHC: 31.5 g/dL — ABNORMAL LOW (ref 32.0–36.0)
MCV: 99 fL (ref 80–100)
PLATELETS: 226 10*3/uL (ref 150–440)
RBC: 3.04 10*6/uL — AB (ref 3.80–5.20)
RDW: 15.9 % — AB (ref 11.5–14.5)
WBC: 9.7 10*3/uL (ref 3.6–11.0)

## 2014-07-05 LAB — URINALYSIS, COMPLETE
BILIRUBIN, UR: NEGATIVE
Blood: NEGATIVE
Glucose,UR: NEGATIVE mg/dL (ref 0–75)
KETONE: NEGATIVE
Leukocyte Esterase: NEGATIVE
Nitrite: POSITIVE
PH: 5 (ref 4.5–8.0)
Protein: NEGATIVE
RBC,UR: NONE SEEN /HPF (ref 0–5)
SPECIFIC GRAVITY: 1.01 (ref 1.003–1.030)
Squamous Epithelial: NONE SEEN

## 2014-07-05 LAB — COMPREHENSIVE METABOLIC PANEL
AST: 30 U/L (ref 15–37)
Albumin: 3.4 g/dL (ref 3.4–5.0)
Alkaline Phosphatase: 122 U/L — ABNORMAL HIGH
Anion Gap: 3 — ABNORMAL LOW (ref 7–16)
BUN: 44 mg/dL — ABNORMAL HIGH (ref 7–18)
Bilirubin,Total: 0.2 mg/dL (ref 0.2–1.0)
CHLORIDE: 108 mmol/L — AB (ref 98–107)
Calcium, Total: 8.5 mg/dL (ref 8.5–10.1)
Co2: 28 mmol/L (ref 21–32)
Creatinine: 2.17 mg/dL — ABNORMAL HIGH (ref 0.60–1.30)
EGFR (African American): 23 — ABNORMAL LOW
EGFR (Non-African Amer.): 19 — ABNORMAL LOW
Glucose: 161 mg/dL — ABNORMAL HIGH (ref 65–99)
Osmolality: 292 (ref 275–301)
Potassium: 5.1 mmol/L (ref 3.5–5.1)
SGPT (ALT): 23 U/L (ref 12–78)
SODIUM: 139 mmol/L (ref 136–145)
Total Protein: 6.9 g/dL (ref 6.4–8.2)

## 2014-07-05 LAB — APTT: ACTIVATED PTT: 30.9 s (ref 23.6–35.9)

## 2014-07-05 LAB — PROTIME-INR
INR: 1
Prothrombin Time: 12.7 secs (ref 11.5–14.7)

## 2014-07-06 LAB — CBC WITH DIFFERENTIAL/PLATELET
BASOS PCT: 0.3 %
Basophil #: 0.1 10*3/uL (ref 0.0–0.1)
Eosinophil #: 0.2 10*3/uL (ref 0.0–0.7)
Eosinophil %: 1.4 %
HCT: 29.5 % — ABNORMAL LOW (ref 35.0–47.0)
HGB: 9.4 g/dL — ABNORMAL LOW (ref 12.0–16.0)
LYMPHS ABS: 0.8 10*3/uL — AB (ref 1.0–3.6)
LYMPHS PCT: 4.2 %
MCH: 31.5 pg (ref 26.0–34.0)
MCHC: 32 g/dL (ref 32.0–36.0)
MCV: 99 fL (ref 80–100)
Monocyte #: 0.7 x10 3/mm (ref 0.2–0.9)
Monocyte %: 4.1 %
NEUTROS ABS: 16.2 10*3/uL — AB (ref 1.4–6.5)
Neutrophil %: 90 %
Platelet: 219 10*3/uL (ref 150–440)
RBC: 2.99 10*6/uL — ABNORMAL LOW (ref 3.80–5.20)
RDW: 15.9 % — AB (ref 11.5–14.5)
WBC: 18 10*3/uL — AB (ref 3.6–11.0)

## 2014-07-06 LAB — BASIC METABOLIC PANEL
ANION GAP: 7 (ref 7–16)
Anion Gap: 6 — ABNORMAL LOW (ref 7–16)
BUN: 43 mg/dL — ABNORMAL HIGH (ref 7–18)
BUN: 43 mg/dL — AB (ref 7–18)
CHLORIDE: 102 mmol/L (ref 98–107)
CO2: 27 mmol/L (ref 21–32)
Calcium, Total: 8.4 mg/dL — ABNORMAL LOW (ref 8.5–10.1)
Calcium, Total: 8.6 mg/dL (ref 8.5–10.1)
Chloride: 106 mmol/L (ref 98–107)
Co2: 30 mmol/L (ref 21–32)
Creatinine: 2.16 mg/dL — ABNORMAL HIGH (ref 0.60–1.30)
Creatinine: 2.16 mg/dL — ABNORMAL HIGH (ref 0.60–1.30)
EGFR (Non-African Amer.): 20 — ABNORMAL LOW
GFR CALC AF AMER: 23 — AB
GFR CALC AF AMER: 23 — AB
GFR CALC NON AF AMER: 20 — AB
GLUCOSE: 330 mg/dL — AB (ref 65–99)
Glucose: 239 mg/dL — ABNORMAL HIGH (ref 65–99)
Osmolality: 294 (ref 275–301)
Osmolality: 304 (ref 275–301)
POTASSIUM: 4.5 mmol/L (ref 3.5–5.1)
Potassium: 6.1 mmol/L — ABNORMAL HIGH (ref 3.5–5.1)
Sodium: 135 mmol/L — ABNORMAL LOW (ref 136–145)
Sodium: 143 mmol/L (ref 136–145)

## 2014-07-07 LAB — BASIC METABOLIC PANEL
Anion Gap: 9 (ref 7–16)
BUN: 45 mg/dL — ABNORMAL HIGH (ref 7–18)
CHLORIDE: 106 mmol/L (ref 98–107)
CO2: 28 mmol/L (ref 21–32)
Calcium, Total: 8.5 mg/dL (ref 8.5–10.1)
Creatinine: 2.11 mg/dL — ABNORMAL HIGH (ref 0.60–1.30)
EGFR (Non-African Amer.): 20 — ABNORMAL LOW
GFR CALC AF AMER: 23 — AB
GLUCOSE: 228 mg/dL — AB (ref 65–99)
OSMOLALITY: 304 (ref 275–301)
Potassium: 4.2 mmol/L (ref 3.5–5.1)
Sodium: 143 mmol/L (ref 136–145)

## 2014-07-07 LAB — CBC WITH DIFFERENTIAL/PLATELET
BASOS PCT: 0.6 %
Basophil #: 0.1 10*3/uL (ref 0.0–0.1)
Eosinophil #: 0.4 10*3/uL (ref 0.0–0.7)
Eosinophil %: 2.9 %
HCT: 28.8 % — ABNORMAL LOW (ref 35.0–47.0)
HGB: 9.3 g/dL — AB (ref 12.0–16.0)
LYMPHS ABS: 1 10*3/uL (ref 1.0–3.6)
Lymphocyte %: 7.6 %
MCH: 31.5 pg (ref 26.0–34.0)
MCHC: 32.4 g/dL (ref 32.0–36.0)
MCV: 97 fL (ref 80–100)
MONO ABS: 0.9 x10 3/mm (ref 0.2–0.9)
MONOS PCT: 6.9 %
Neutrophil #: 11.2 10*3/uL — ABNORMAL HIGH (ref 1.4–6.5)
Neutrophil %: 82 %
Platelet: 215 10*3/uL (ref 150–440)
RBC: 2.96 10*6/uL — ABNORMAL LOW (ref 3.80–5.20)
RDW: 15.9 % — ABNORMAL HIGH (ref 11.5–14.5)
WBC: 13.6 10*3/uL — ABNORMAL HIGH (ref 3.6–11.0)

## 2014-07-08 LAB — CBC WITH DIFFERENTIAL/PLATELET
BASOS PCT: 0.3 %
Basophil #: 0 10*3/uL (ref 0.0–0.1)
EOS PCT: 0.3 %
Eosinophil #: 0 10*3/uL (ref 0.0–0.7)
HCT: 23.6 % — ABNORMAL LOW (ref 35.0–47.0)
HGB: 7.3 g/dL — ABNORMAL LOW (ref 12.0–16.0)
LYMPHS ABS: 1 10*3/uL (ref 1.0–3.6)
Lymphocyte %: 6.6 %
MCH: 30.6 pg (ref 26.0–34.0)
MCHC: 31 g/dL — ABNORMAL LOW (ref 32.0–36.0)
MCV: 99 fL (ref 80–100)
Monocyte #: 1.5 x10 3/mm — ABNORMAL HIGH (ref 0.2–0.9)
Monocyte %: 10 %
NEUTROS PCT: 82.8 %
Neutrophil #: 12.1 10*3/uL — ABNORMAL HIGH (ref 1.4–6.5)
PLATELETS: 174 10*3/uL (ref 150–440)
RBC: 2.39 10*6/uL — ABNORMAL LOW (ref 3.80–5.20)
RDW: 15.7 % — ABNORMAL HIGH (ref 11.5–14.5)
WBC: 14.6 10*3/uL — ABNORMAL HIGH (ref 3.6–11.0)

## 2014-07-08 LAB — BASIC METABOLIC PANEL
Anion Gap: 10 (ref 7–16)
BUN: 44 mg/dL — ABNORMAL HIGH (ref 7–18)
CHLORIDE: 104 mmol/L (ref 98–107)
CO2: 27 mmol/L (ref 21–32)
CREATININE: 2.28 mg/dL — AB (ref 0.60–1.30)
Calcium, Total: 7.8 mg/dL — ABNORMAL LOW (ref 8.5–10.1)
EGFR (African American): 21 — ABNORMAL LOW
GFR CALC NON AF AMER: 18 — AB
Glucose: 246 mg/dL — ABNORMAL HIGH (ref 65–99)
Osmolality: 301 (ref 275–301)
Potassium: 3.9 mmol/L (ref 3.5–5.1)
Sodium: 141 mmol/L (ref 136–145)

## 2014-07-08 LAB — HEMATOCRIT: HCT: 24.3 % — AB (ref 35.0–47.0)

## 2014-07-08 LAB — URINE CULTURE

## 2014-07-09 LAB — BASIC METABOLIC PANEL
ANION GAP: 9 (ref 7–16)
BUN: 52 mg/dL — ABNORMAL HIGH (ref 7–18)
CHLORIDE: 103 mmol/L (ref 98–107)
Calcium, Total: 7.9 mg/dL — ABNORMAL LOW (ref 8.5–10.1)
Co2: 26 mmol/L (ref 21–32)
Creatinine: 2.49 mg/dL — ABNORMAL HIGH (ref 0.60–1.30)
EGFR (African American): 19 — ABNORMAL LOW
GFR CALC NON AF AMER: 16 — AB
Glucose: 305 mg/dL — ABNORMAL HIGH (ref 65–99)
Osmolality: 301 (ref 275–301)
Potassium: 3.6 mmol/L (ref 3.5–5.1)
Sodium: 138 mmol/L (ref 136–145)

## 2014-07-09 LAB — HEMOGLOBIN: HGB: 8.7 g/dL — ABNORMAL LOW (ref 12.0–16.0)

## 2014-07-10 LAB — CBC WITH DIFFERENTIAL/PLATELET
Basophil #: 0 10*3/uL (ref 0.0–0.1)
Basophil %: 0.4 %
EOS ABS: 0.2 10*3/uL (ref 0.0–0.7)
EOS PCT: 1.4 %
HCT: 25.8 % — ABNORMAL LOW (ref 35.0–47.0)
HGB: 8.4 g/dL — AB (ref 12.0–16.0)
LYMPHS ABS: 1.1 10*3/uL (ref 1.0–3.6)
LYMPHS PCT: 8 %
MCH: 30.2 pg (ref 26.0–34.0)
MCHC: 32.4 g/dL (ref 32.0–36.0)
MCV: 93 fL (ref 80–100)
MONO ABS: 1.3 x10 3/mm — AB (ref 0.2–0.9)
MONOS PCT: 9.7 %
Neutrophil #: 10.7 10*3/uL — ABNORMAL HIGH (ref 1.4–6.5)
Neutrophil %: 80.5 %
Platelet: 183 10*3/uL (ref 150–440)
RBC: 2.77 10*6/uL — AB (ref 3.80–5.20)
RDW: 17 % — ABNORMAL HIGH (ref 11.5–14.5)
WBC: 13.3 10*3/uL — AB (ref 3.6–11.0)

## 2014-07-10 LAB — BASIC METABOLIC PANEL
Anion Gap: 7 (ref 7–16)
BUN: 64 mg/dL — ABNORMAL HIGH (ref 7–18)
CHLORIDE: 105 mmol/L (ref 98–107)
Calcium, Total: 7.6 mg/dL — ABNORMAL LOW (ref 8.5–10.1)
Co2: 27 mmol/L (ref 21–32)
Creatinine: 2.46 mg/dL — ABNORMAL HIGH (ref 0.60–1.30)
EGFR (African American): 19 — ABNORMAL LOW
EGFR (Non-African Amer.): 17 — ABNORMAL LOW
Glucose: 251 mg/dL — ABNORMAL HIGH (ref 65–99)
Osmolality: 304 (ref 275–301)
Potassium: 3.3 mmol/L — ABNORMAL LOW (ref 3.5–5.1)
SODIUM: 139 mmol/L (ref 136–145)

## 2014-07-11 ENCOUNTER — Encounter: Payer: Self-pay | Admitting: Internal Medicine

## 2014-07-12 LAB — PATHOLOGY REPORT

## 2014-07-20 ENCOUNTER — Encounter: Payer: Self-pay | Admitting: Internal Medicine

## 2014-08-20 ENCOUNTER — Encounter: Payer: Self-pay | Admitting: Internal Medicine

## 2014-09-19 ENCOUNTER — Encounter: Payer: Self-pay | Admitting: Internal Medicine

## 2014-10-20 ENCOUNTER — Encounter: Payer: Self-pay | Admitting: Internal Medicine

## 2014-11-19 ENCOUNTER — Encounter: Payer: Self-pay | Admitting: Internal Medicine

## 2014-12-20 ENCOUNTER — Ambulatory Visit
Admit: 2014-12-20 | Disposition: A | Discharge status: Home / Self Care | Payer: Self-pay | Attending: Nurse Practitioner | Admitting: Nurse Practitioner

## 2014-12-20 ENCOUNTER — Encounter: Payer: Self-pay | Admitting: Internal Medicine

## 2015-01-07 LAB — LIPID PANEL
Cholesterol: 165 mg/dL (ref 0–200)
HDL Cholesterol: 69 mg/dL — ABNORMAL HIGH (ref 40–60)
LDL CHOLESTEROL, CALC: 84 mg/dL (ref 0–100)
TRIGLYCERIDES: 62 mg/dL (ref 0–200)
VLDL Cholesterol, Calc: 12 mg/dL (ref 5–40)

## 2015-01-07 LAB — COMPREHENSIVE METABOLIC PANEL
Albumin: 3.4 g/dL (ref 3.4–5.0)
Alkaline Phosphatase: 86 U/L
Anion Gap: 6 — ABNORMAL LOW (ref 7–16)
BUN: 75 mg/dL — ABNORMAL HIGH (ref 7–18)
Bilirubin,Total: 0.3 mg/dL (ref 0.2–1.0)
Calcium, Total: 8.6 mg/dL (ref 8.5–10.1)
Chloride: 107 mmol/L (ref 98–107)
Co2: 26 mmol/L (ref 21–32)
Creatinine: 2.84 mg/dL — ABNORMAL HIGH (ref 0.60–1.30)
EGFR (African American): 20 — ABNORMAL LOW
EGFR (Non-African Amer.): 17 — ABNORMAL LOW
Glucose: 123 mg/dL — ABNORMAL HIGH (ref 65–99)
Osmolality: 301 (ref 275–301)
Potassium: 5.3 mmol/L — ABNORMAL HIGH (ref 3.5–5.1)
SGOT(AST): 17 U/L (ref 15–37)
SGPT (ALT): 15 U/L
Sodium: 139 mmol/L (ref 136–145)
Total Protein: 6.8 g/dL (ref 6.4–8.2)

## 2015-01-07 LAB — CBC WITH DIFFERENTIAL/PLATELET
BASOS ABS: 0.1 10*3/uL (ref 0.0–0.1)
Basophil %: 1.1 %
Eosinophil #: 0.7 10*3/uL (ref 0.0–0.7)
Eosinophil %: 7 %
HCT: 31.5 % — ABNORMAL LOW (ref 35.0–47.0)
HGB: 10 g/dL — ABNORMAL LOW (ref 12.0–16.0)
Lymphocyte #: 3 10*3/uL (ref 1.0–3.6)
Lymphocyte %: 28.6 %
MCH: 31.5 pg (ref 26.0–34.0)
MCHC: 31.8 g/dL — ABNORMAL LOW (ref 32.0–36.0)
MCV: 99 fL (ref 80–100)
Monocyte #: 0.9 x10 3/mm (ref 0.2–0.9)
Monocyte %: 8.8 %
NEUTROS PCT: 54.5 %
Neutrophil #: 5.7 10*3/uL (ref 1.4–6.5)
PLATELETS: 290 10*3/uL (ref 150–440)
RBC: 3.19 10*6/uL — AB (ref 3.80–5.20)
RDW: 15.3 % — AB (ref 11.5–14.5)
WBC: 10.4 10*3/uL (ref 3.6–11.0)

## 2015-01-07 LAB — TSH: Thyroid Stimulating Horm: 3.66 u[IU]/mL

## 2015-01-07 LAB — HEMOGLOBIN A1C: Hemoglobin A1C: 6.6 % — ABNORMAL HIGH (ref 4.2–6.3)

## 2015-01-07 LAB — MAGNESIUM: Magnesium: 2 mg/dL

## 2015-01-20 ENCOUNTER — Encounter: Payer: Self-pay | Admitting: Internal Medicine

## 2015-02-18 ENCOUNTER — Encounter
Admit: 2015-02-18 | Disposition: A | Discharge status: Home / Self Care | Payer: Self-pay | Attending: Internal Medicine | Admitting: Internal Medicine

## 2015-03-21 ENCOUNTER — Encounter
Admit: 2015-03-21 | Disposition: A | Discharge status: Home / Self Care | Payer: Self-pay | Attending: Internal Medicine | Admitting: Internal Medicine

## 2015-04-11 NOTE — Consult Note (Signed)
PATIENT NAME:  Patricia MillmanZOLLER, Geroldine MR#:  [id number] DATE OF BIRTH:  [birth year]  DATE OF CONSULTATION:  04/13/2013  CONSULTING PHYSICIAN:  Rhona RaiderMatthew G. Rollan Roger, DPM  HISTORY OF PRESENT ILLNESS: She was admitted to the hospital for multiple issues, pulmonary issues as well as renal failure. She has been also describing bilateral leg pain. She has also had increasing pain in her right foot over the last week. Medical history and reason for admission was positive for right-sided systolic heart failure, increased swelling and low oxygen saturation rate. Primarily admitted for shortness of breath and bilateral lower leg pain.   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   PAST SURGICAL HISTORY: Includes the acoustic neuroma excision and also left hip replacement and cataract surgery.   SOCIAL HISTORY: Denies EtOH. Denies current tobacco use, does have a remote history. She now lives at [facility] in assisted living, and she is widowed.   MEDICINES ON ADMISSION: Included:  1. Acetaminophen.  2. Allopurinol 100 mg daily.  3. Amlodipine 10 mg daily.  4. Fexofenadine 180 mg. 5. Furosemide 40 mg. 6. Humulin sliding scale. 7. NovoLog mix 70/30 at 12 units in the morning, 14 in the evening.  8. Isosorbide mononitrate. 9. Losartan 100 mg once a day. 10. Loperamide. 11. Maalox as needed.  12. Pantoprazole or Protonix 40 mg twice a day.  13. Milk of Magnesia as needed.  14. Potassium supplements. 15. Ropinirole 0.25 mg daily.  16. Slow release iron.   ALLERGIES: INCLUDE ACE INHIBITORS, CLONIDINE, HYTRIN, METFORMIN, MINOXIDIL.   PHYSICAL EXAMINATION: VITAL SIGNS: Include at present, temperature is 98.4,  pulse 68, respirations 19, blood pressure 169/66 and pulse oximetry is 95.  GENERAL: She is alert and is able to talk to me. Does not appear to be in any distress other than pain in her right foot.  LOWER EXTREMITY EXAM: Vascular: DP pulses are palpable bilaterally. Has a little bit of edema to both feet and legs. She has, on orthopedic exam of the lower extremity, erythema to the right first MTP joint. This is bright red and consistent with likely gout. Dermatologic: There are no ulcerations or skin breaks or damage to the region.   CLINICAL IMPRESSION: Acute gout flare, right first metatarsophalangeal joint.   TREATMENT PLAN: Would recommend probably switching her from allopurinol to Uloric because of renal function issues and see if that does not help reduce some of her issues, but primarily get rid of the acute problems first with the uric acid. I would recommend IM injection 40 mg of Kenalog IM for more rapid reduction of the inflammation, followed by trying to reduce her overall uric acid by changing her allopurinol maybe to the Uloric if that is amenable to all involved. I will follow her tomorrow and see if things are better from an overall standpoint.   ____________________________ Rhona RaiderMatthew G. Jaben Benegas, DPM mgt:OSi D: 04/13/2013 11:33:05 ET T: 04/13/2013 11:56:22 ET JOB#: [id number]  cc: Rhona RaiderMatthew G. Daquarius Dubeau, DPM, <Dictator> Epimenio SarinMATTHEW G Jevon Littlepage MD ELECTRONICALLY SIGNED 05/01/2013 17:57

## 2015-04-11 NOTE — Discharge Summary (Signed)
PATIENT NAME:  Patricia Cameron, Patricia Cameron MR#:  [id number] DATE OF BIRTH:  Mar 18, 1921  DATE OF ADMISSION:  07/10/2013 DATE OF DISCHARGE:  07/11/2013   FINAL DIAGNOSES:  1. Acute myocardial infarction.  2. Chronic right-sided diastolic congestive heart failure.  3. Chronic kidney disease stage III.  4. Obstructive sleep apnea, noncompliant with continuous positive airway pressure.  5. Chronic obstructive pulmonary disease.  6. Hypertension.  7. Adult-onset diabetes mellitus, insulin requiring, uncontrolled.  8. Gout.  9. Dementia.  10. Macular degeneration.  11. History of acoustic neuroma with right-sided facial paralysis and vision loss.   HISTORY AND PHYSICAL: Please see dictated admission history and physical.   SUMMARY OF HOSPITAL COURSE: The patient was admitted with left-sided arm and chest pain, transient. Cardiac enzymes were subsequently positive, consistent with myocardial infarction. Cardiology was asked to see the patient, as was palliative care. In discussion with palliative care through the family and in discussion with the daughter through palliative care and through myself, they were not interested in invasive evaluation or further treatment beyond making sure that the patient was comfortable and beyond medical management. Cardiology agreed that the patient would not be a candidate for cardiac catheterization secondary to her chronic kidney disease. Therefore, now that the patient had been pain free for 24 hours, the plan will be to have her return to the nursing home from which she has been a resident for quite a bit of time. Family members will ask hospice to follow her there. They would prefer that she not come back to the hospital for further hospitalizations if at all possible, and are comfortable with more aggressive measures for comfort if necessary. The patient should be weighed daily, physician call for more than 2-pound gain in 1 day or 5 pounds in 1 week or increasing signs or  symptoms of heart failure. She was initiated on statin therapy, and beta blocker was started, which she tolerated well. Her diet will be no added salt, no concentrated sweets. She should have her blood sugar checked 4 times a day and sliding scale insulin applied as needed. She will be on oxygen 2 liters nasal cannula. Hospice is to follow the patient at the nursing facility. She has an out of facility DNR order that will go with her.   DISCHARGE MEDICATIONS:  1. Amlodipine 10 mg p.o. b.i.d.  2. Levothyroxine 0.05 mg p.o. daily.  3. Losartan 100 mg p.o. daily.  4. Ropinirole 0.25 mg p.o. at bedtime.  5. Pantoprazole 40 mg p.o. b.i.d.  6. Slow Fe 45 mg p.o. daily.  7. Maalox 30 mL up to 4 times a day as needed for indigestion or heartburn.  8. Regular sliding scale insulin as directed.  9. Potassium 10 mEq p.o. every 48 hours.  10. NovoLog 70/30 16 units subcutaneous b.i.d.  11. Fexofenadine 60 mg p.o. daily.  12. Allopurinol 100 mg p.o. daily.  13. Torsemide 10 mg p.o. daily.  14. Loperamide 2 mg 2 capsules daily as needed for diarrhea.  15. Imdur 30 mg p.o. daily.  16. Nitroglycerin 0.4 mg sublingually every 5 minutes x3 p.r.n. chest pain, max 3 doses in 24 hours.  17. Aspirin 81 mg p.o. daily.  18. Lopressor 25 mg p.o. b.i.d.  19. DuoNeb SVN 3 mL inhaled every 6 hours as needed for wheezing.  20. Proctosol HC rectal cream applied b.i.d. p.r.n. hemorrhoids.  21. Glucagon 1 mg injectable as needed for severe hypoglycemia.  22. Dextrose 50% solution 25 mg IV as needed for hypoglycemia.  23. Cipro 250 mg p.o. b.i.d. for 5 days for urinary tract infection.  24. Atorvastatin 20 mg p.o. at bedtime.   CODE STATUS: As noted above, the patient is do not resuscitate.   TIME SPENT: Time of discharge 35 minutes.   ____________________________ Lynnea FerrierBert J. Jenissa Tyrell III, MD bjk:OSi D: 07/11/2013 08:12:52 ET T: 07/11/2013 09:07:21 ET JOB#: [id number]  cc: Lynnea FerrierBert J. Shermaine Brigham III, MD, <Dictator> Daniel NonesBERT Flossie Wexler  MD ELECTRONICALLY SIGNED 07/12/2013 22:16

## 2015-04-11 NOTE — Discharge Summary (Signed)
PATIENT NAME:  Patricia Cameron, AUVIL MR#:  [id number] DATE OF BIRTH:  1921/04/17  DATE OF ADMISSION:  01/24/2013 DATE OF DISCHARGE:  01/30/2013  FINAL DIAGNOSES: 1.  Acute on chronic blood loss anemia.  2.  Acute on chronic right-sided systolic congestive heart failure secondary to #1.  3.  Obstructive sleep apnea, untreated secondary to patient's desire as well history of Bell's palsy making to difficult to get mask to fit. 4.  Bell's palsy secondary to resection of acoustic neuroma on the right.  5.  Hypertension.  6.  Diabetes.  7.  Diverticulosis.  8.  Recurrent diarrhea.  9.  Macular degeneration.  10.  Hyperlipidemia.  11.  History of pelvic fracture.  12.  History of cataract repair.  13.  History of left hip replacement.   HISTORY AND PHYSICAL: Please see dictated admission history and physical.   HOSPITAL COURSE: The patient was admitted with increasing shortness of breath and signs, symptoms which were concerning for worsening congestive heart failure and pulmonary edema. She has no known chronic right-sided systolic congestive secondary to heart sleep apnea. She was not noted to have significant edema on arrival; however, there did appear some increasing fluid retention within her lungs in the films.  However, she was hemoglobin down to about 8. She had been noted to have hemoglobin of approximately 11 in November, down from baseline 13 to 14. At that time, she had been confirmed of iron deficiency, and heme-positive stool and had deferred endoscopy. She was transfused 1 unit packed cells and symptoms improved significantly. Diuresis was limited by her underlying chronic kidney disease, stage IV.   GI was asked to see the patient, and they discussed possible endoscopy with the patient.  As she wanted to find out if there is anything more serious going on and for further diagnosis and treatment options, she did undergo endoscopy on 01/29/2013. This revealed multiple AV malformations  which was thought to be the source of GI bleeding. She has significant hiatal hernia, very mild gastritis, multiple diverticula and what looks like a lipoma with biopsies pending.   The AVMs were cauterized. The patient and family members are aware, however, that there may be multiple AVMs within the small bowel which we cannot see nor treat, there was a possibility that the iron levels will began to fall. Hemoglobin levels stayed stable at over 9 following transfusion.   Medications were slowly reintroduced for control of her blood pressure. She was continued on oxygen. She was ambulated with physical therapy, and that showed some significant balance issues, and it is felt that she would not be able to return to her prior level of care, which was the patient at home alone most of the time. To that end, the patient was to be sent to a skilled nursing facility for rehabilitation with occupational therapy and physical therapy consultations. Ultimately, she may require assisted living or rest home, however, as the family members are quite concerned about the patient being on her in the future. Should be weighed daily, call the physician for more than 2-pound gain in one day or 5 pounds in 1week or increasing signs or symptoms of heart failure. The nursing facility physician will see her and the labs will be arranged for at his discretion. She remained on 2 liters nasal cannula continuously. Her diet should be no added salt, no concentrated sweets. Her physical activity should be up as tolerated with a walker and with oxygen.   DISCHARGE MEDICATIONS: 1.  Levothyroxine  0.05 mg p.o. daily. 2.  Losartan 100 mg p.o. daily.  3.  Insulin 70/30, 14 units subcutaneous b.i.d.  4.  Zyrtec 10 mg p.o. daily.  5.  Lasix 40 mg p.o. daily.  6.  Potassium 10 mg p.o. daily.  7.  Pantoprazole 40 mg p.o. b.i.d.  8.  Sliding scale insulin as directed.  9.  Prednisone taper, 30 mg p.o. daily, decreasing by 10 mg every 2 days  for acute gout flare in the left index finger.  10.  Imdur 30 mg p.o. daily.  11.  Allopurinol 100 mg p.o. daily, recently started. 12.  Ropinirole  0.25 mg p.o. daily.  13.  Levalbuterol 0.63 mg inhaled every 6 hours as needed for wheezing.  14.  Slow Fe 160 mg p.o. daily, to be started on 02/01/2013.  15,  At this time the patient's aspirin was held secondary to GI bleeding.   CODE STATUS DURING HIS HOSPITALIZATION: DO NOT RESUSCITATE.   Time spent on discharge: 45 minutes.    ____________________________ Lynnea FerrierBert J. Jenner Rosier III, MD bjk:ct D: 01/30/2013 13:19:00 ET T: 01/30/2013 14:17:10 ET JOB#: [id number]  cc: Lynnea FerrierBert J. Daliyah Sramek III, MD, <Dictator> Daniel NonesBERT Atiyah Bauer MD ELECTRONICALLY SIGNED 02/05/2013 19:58

## 2015-04-11 NOTE — Consult Note (Signed)
Pt seen and examined. Please see Dawn Harrison's notes. Pt did talk to her daughter after seeing Dawn. Pt now agreeable to having both colonoscopy and EGD done. Hopefully, breathing will continue to improve. Plan procedures on Monday after she is prepped on Sunday. Thanks.  Electronic Signatures: Lutricia Feilh, Brigitte Soderberg (MD)  (Signed on 07-Feb-14 15:46)  Authored  Last Updated: 07-Feb-14 15:46 by Lutricia Feilh, Marvel Sapp (MD)

## 2015-04-11 NOTE — Consult Note (Signed)
PATIENT NAME:  Patricia Cameron, TESCH MR#:  [id number] DATE OF BIRTH:  Dec 06, 1921  DATE OF CONSULTATION:  01/26/2013  REFERRING PHYSICIAN:    CONSULTING PHYSICIAN:  Payton Emerald, NP and Dr. Verdie Shire.  PRIMARY CARE PHYSICIAN: Ramonita Lab MD  ATTENDING:  Ramonita Lab, MD  REASON FOR CONSULTATION: Iron deficiency anemia.   HISTORY OF PRESENT ILLNESS: The patient is an 81 year old Caucasian female who presented to [facility] for the concern of shortness of breath. She has significant medical history of hypertension, diabetes, diverticulosis, recurrent diarrhea, macular degeneration, hyperlipidemia, chronic anemia, a history of right-sided systolic congestive heart failure secondary to sleep apnea. She does refuse to use CPAP in the past. Additionally medical history pertinent for bradycardia, resection of acoustic neuroma, right-sided nerve paralysis involving VII. She presented to the Emergency Room with a history of shortness of breath for the past week, worsening especially with exertion. She does reside by herself. States she could "barely go to the bathroom without being short of breath. No associated chest pain. Edema to her feet as well as puffiness to her hands. She was noted to have worsening of acute on chronic renal failure with an elevated BNP of 2248. Creatinine was slightly elevated above baseline. Her O2 sat was 87% on room air with ambulation. Currently she is on O2, 2 liters, appears to be resting comfortably in bed, actually she was sleeping and was arousable with verbal stimuli and touch.   In review of our medical chart, she had a colonoscopy which was performed by Dr. Purcell Nails [place] 01/27/2005 which revealed one polyp being found and retrieved from sigmoid colon, Pathology report, adenomatous. A large lipoma in the sigmoid colon at 25 cm from the anal verge, diverticulosis. Biopsies were obtained as it was done for the indication of diarrhea. Biopsies unremarkable.  Additionally EGD was done at this time for the indication of iron deficiency anemia as well as heme-positive stool, Savary-Miller grade II esophagitis with no bleeding was found. A single clean-based esophageal ulcer was noted as well. A small hiatal hernia and a few localized erosions with no bleeding found in the gastric atrium. Biopsies were taken of this area as well as the duodenum. A summary of labs revealed evidence of chronic active gastritis with microorganisms consistent with Helicobacter pylori. Exudate and inflamed squamous mucosa with granulation tissue formation consistent with an ulceration noted. She was treated with antibiotic therapy for Helicobacter pylori at that time. In going back several years for her lab work, 01/13/2005 her iron level was 17, ferritin was 16, and iron saturation was 4 at that time.   HOME MEDICATIONS: It does not appear that she is on an iron supplement.   A GI consultation was requested by Dr. Ramonita Lab as feel that the evidence of anemia is probably consistent with chronic GI blood loss. Apparently it was discussed with her last year the consideration of proceeding forward with endoscopy evaluation, but she had declined at that time. Currently she is willing to be seen by our service and to have this readdressed. She has had a known history of hemorrhoids, states for the past 6 months intermittently she has noticed evidence of bright-red blood on toilet paper. Her normal bowel pattern is every 2 to 3 days. Consistency is loose to watery at times. She denies any melena, abdominal discomfort to the lower abdomen is intermittent has been present as well for the past several months. No associated nausea, no vomiting. No history of reflux which she is  on pantoprazole 40 mg twice a day. Continues to experience water brash on a pretty regular basis over the past year despite PPI therapy, denies any NSAID use. Takes Tylenol as needed for pain. Appetite is good, weight  fluctuates by 1 to 3 pounds which she feels is related to fluid. Has a good appetite. Complaint of hoarse voice. No dysphagia. Chronic dizziness in correlation from the timeframe of having been diagnosed with acoustic neuroma.   HOME MEDICATIONS: Amlodipine 10 mg daily, aspirin 81 mg a day, Lasix 40 mg once a day, Levothyroxine 50 mcg daily, losartan 100 mg daily, Novalog 70/30 14 units twice a day, pantoprazole 40 mg twice a day and potassium 10 mEq a day and Zyrtec 10 mg daily.   ALLERGIES: ACE INHIBITOR, COUGH; INTOLERANCE TO CLONIDINE, HYTRIN, METFORMIN, MINOXIDIL, VITAMIN D.   PAST MEDICAL AND SURGICAL HISTORY: Hypertension, diabetes, diverticulosis, colonic polyps adenomatous, chronic diarrhea, macular degeneration, iron deficiency anemia of chronic origin, hyperlipidemia, right-sided systolic congestive heart failure is felt to be secondary to sleep apnea. The patient does not adhere to using CPAP. Bradycardia with beta blocker, cholelithiasis, pelvic fracture, hypothyroidism, and acoustic neuroma status post resection, right side, involving cranial nerve VII, significant for paralysis, cataract repair, left, and history of hip replacement at approximately the age of 89. And then a shoulder fracture status post fall. Chronic dizziness.   FAMILY HISTORY: Diabetes. No history of neoplasm, specifically colon cancer or colonic polyps. Significant for dementia.   SOCIAL HISTORY: Resides by herself, remote tobacco use. No alcohol use.   REVIEW OF SYSTEMS:  CONSTITUTIONAL: Significant for fatigue and weakness. No significant weight loss. See HPI. No fevers.  EYES: A history of cataracts, does wear glasses.  ENT: Some chronic rhinorrhea.  RESPIRATORY: Significant for a chronic cough, shortness of breath especially with dyspnea with exertion. No history of COPD, a history of CHF.  CARDIOVASCULAR: No chest pain. Significant for edema to lower extremities, a history of CHF.  GASTROINTESTINAL: See HPI.   GENITOURINARY: Denies any dysuria or hematuria.  HEMATOLOGIC AND LYMPH: A history of chronic anemia.  SKIN: No rashes. No lesions.  MUSCULOSKELETAL: No neuralgias or myalgias.  NEUROLOGIC: A history of acoustic neuroma, right-sided cranial nerve VII deficit. No history of CVA or TIA. PSYCHIATRIC: Some insomnia. No depression. No anxiety.   PHYSICAL EXAMINATION:  VITAL SIGNS: Temperature is 97.8, pulse is 65, respirations are 18, blood pressure is 146/64. Pulse ox is 97% on O2.  GENERAL: A well developed, well nourished 81 year old Caucasian female, no acute distress noted. Pleasant. Resting comfortably in bed.  HEENT: Normocephalic, atraumatic. evidence of right-sided facial drooping and cranial nerves VII paralysis.  NECK: Supple. Trachea midline. No lymphadenopathy.  CARDIOVASCULAR: Regular rate and rhythm, S1, S2. 2 to 3/6 systolic murmur.  LUNGS: Decreased breath sounds. Scattered expiratory wheezes. No crackles.  ABDOMEN: Soft, nondistended. Bowel sounds in 4 quadrants. No bruits. No masses.  RECTAL: Deferred.  MUSCULOSKELETAL: Moving all 4 extremities. No contractures. No clubbing.  EXTREMITIES: No edema.  SKIN: Color pale, warm, dry. No lesions. No rashes.  NEUROLOGICAL: Right-sided cranial nerve VII paralysis, decreased sensation on the right subjective, strength is 5/5 of all extremities.   LABORATORY, DIAGNOSTIC AND RADIOLOGICAL DATA: Chemistry panel on admission, B-type natriuretic peptide elevated at 2248 with a BUN of 58 and creatinine is 2.33, iron level is low at 43, EGFR is 18. TIBC was 463. Iron saturation is 9%, calcium was 8.7, magnesium 2.1 and ferritin 10. Hemoglobin A1c was 6.4.   Chemistry panel  01/26/2013: BUN is 48, creatinine is 2.24, calcium remained stable at 8.6. Cardiac enzymes: CK and total, first in series 318 with a CK-MB of 3.0. Troponin 0.06; second in series, CK total is 426 with a CK-MB of 2.9 and troponin 0.06; and third in series, troponin elevated at  0.08 TSH is 1.38.   Hemoglobin 8.9 with hematocrit of 28.4, MCV low at 75, MCH is 22.7, MCHC is 31.4, and RDW elevated at 18.4. WBC count was 10.6 yesterday. The hemoglobin dropped to 8.4. The patient has been typed and crossmatched for 1 unit of blood which has been transfused. Today at 5:45 a.m., hemoglobin 9.5 with hematocrit of 29.4. Urinalysis on admission essentially unremarkable.   EKG revealed accelerated junctional rhythm, nonspecific ST abnormality. Echocardiogram was done yesterday with interpretation summary of left ventricular systolic function normal with an EF of 55, moderate concentric left ventricular hypertrophy. The left atrium is mildly dilated, the right atrium is moderately dilated. Moderate mitral regurgitation and moderate tricuspid regurgitation.   Chest, PA and lateral, on admission:  Findings of pulmonary venous hypertension suggestive of mild interstitial edema.   Doppler lower extremity ultrasound revealed no evidence of DVT of bilateral lower extremities.   IMPRESSION:  1. Iron deficiency anemia felt to be secondary to chronic blood loss. Unfortunately fecal occult study has not been able to be performed, is ordered. Gastrointestinal consultation requested to discussed proceeding forward with endoscopy evaluation.  2. Congestive heart failure, probably exacerbated given the progression of anemia. Echocardiogram reveals stable LF function with significant mitral and tricuspid regurgitation. Minimal troponin elevation consistent with demand ischemia. The patient has undergone cardiac evaluation with Dr. Nehemiah Massed during her hospitalization.  3. Hypertension.   PLAN: The patient's presentation will be discussed with Dr. Verdie Shire. I did discuss in great length with the patient proceeding forward with a colonoscopy as well as an upper endoscopy for the indication of iron deficiency anemia as well as her known history of colonic polyps, adenomatous, noted 2006, with no  surveillance having been done since that timeframe. Also exacerbation of reflux which has not been well-controlled over the past year. A history of gastric as well as esophageal ulcers in the past. The patient wishes to speak with her daughter first prior to consenting to proceed forward in this manner. We will have Dr. Candace Cruise readdress this with her after she speaks with her daughter. We will continue to monitor during her hospitalization. Continue with PPI therapy.   These services provided by Ebony Cargo, NP, under collaborative agreement with Dr. Verdie Shire.  ____________________________ Payton Emerald, NP dsh:jm D: 01/26/2013 12:06:24 ET T: 01/26/2013 12:31:36 ET JOB#: [id number]  cc: Payton Emerald, NP, <Dictator> Payton Emerald MD ELECTRONICALLY SIGNED 01/29/2013 7:49

## 2015-04-11 NOTE — Consult Note (Signed)
Chief Complaint:  Subjective/Chief Complaint Breathing ok on O2. Fe def anemia.   VITAL SIGNS/ANCILLARY NOTES: **Vital Signs.:   09-Feb-14 07:47  Vital Signs Type Routine  Temperature Temperature (F) 98.3  Celsius 36.8  Temperature Source oral  Pulse Pulse 56  Respirations Respirations 18  Systolic BP Systolic BP 146  Diastolic BP (mmHg) Diastolic BP (mmHg) 63  Mean BP 90  Pulse Ox % Pulse Ox % 96  Pulse Ox Activity Level  At rest  Oxygen Delivery 2L   Brief Assessment:  Cardiac Regular   Respiratory clear BS   Gastrointestinal Normal   Lab Results:  Routine Hem:  09-Feb-14 05:26   WBC (CBC) 10.2  RBC (CBC)  3.61  Hemoglobin (CBC)  8.9  Hematocrit (CBC)  27.6  Platelet Count (CBC) 261  MCV  77  MCH  24.7  MCHC 32.3  RDW  17.9  Neutrophil % 79.4  Lymphocyte % 11.6  Monocyte % 8.3  Eosinophil % 0.3  Basophil % 0.4  Neutrophil #  8.1  Lymphocyte # 1.2  Monocyte # 0.8  Eosinophil # 0.0  Basophil # 0.0 (Result(s) reported on 28 Jan 2013 at 05:49AM.)   Assessment/Plan:  Assessment/Plan:  Assessment Fe def anemia. Hx of polyps and gastritis.   Plan Bowel prep this afternoon. EGD/colon tomorrow. Thanks.   Electronic Signatures: Lutricia Feilh, Adyn Serna (MD)  (Signed 09-Feb-14 09:30)  Authored: Chief Complaint, VITAL SIGNS/ANCILLARY NOTES, Brief Assessment, Lab Results, Assessment/Plan   Last Updated: 09-Feb-14 09:30 by Lutricia Feilh, Kolson Chovanec (MD)

## 2015-04-11 NOTE — H&P (Signed)
PATIENT NAME:  Patricia Cameron, Patricia Cameron MR#:  [id number] DATE OF BIRTH:  March 30, 1921  DATE OF ADMISSION:  01/24/2013  REFERRING PHYSICIAN:  Maurilio Lovely, MD  PRIMARY CARE PHYSICIAN:  Lynnea Ferrier, MD, at [facility].   CHIEF COMPLAINT:  Shortness of breath.   HISTORY OF PRESENT ILLNESS:  The patient is a pleasant 81 year old Caucasian female who lives by herself. She has multiple medical issues including diabetes, chronic right-sided systolic congestive heart failure in the setting of sleep apnea and has refused CPAP, hypertension, and history of left hip replacement. She presents to the hospital for complaint of shortness of breath for the past week or so with worsening chronic dyspnea on exertion. She could at least go out of the house, but now she cannot barely go to the bathroom without being short of breath. She has no chest pain, but she has had some puffiness in her hands more so than her feet. Here, she was noted to have worsening of acute on chronic renal failure and elevated BNP of 2248. Her creatinine is slightly above baseline. She did drop her sats to 87% on room air with ambulation and hospitalist services were contacted for further evaluation and management.   PAST MEDICAL HISTORY:  Hypertension, diabetes, diverticulosis, recurrent diarrhea, macular degeneration, hyperlipidemia, chronic anemia, history of right-sided systolic CHF thought to be secondary to sleep apnea and she has refused CPAP in the past and has had bradycardia with beta-blocker, sleep apnea, history of cholelithiasis, history of pelvic fracture, hypothyroidism, history of resection of acoustic neuroma with right-sided VII nerve paralysis, cataract repair on the left and history of left hip replacement.   ALLERGIES:  ACE INHIBITOR WITH COUGH, INTOLERANT OF CLONIDINE, HYTRIN, METFORMIN, MINOXIDIL AND VITAMIN D. SHE HAS HAD BRADYCARDIA WITH BETA-BLOCKER PER CHART.   OUTPATIENT MEDICATIONS:  Amlodipine 10 mg daily,  aspirin 81 mg daily, Lasix 40 mg once a day, levothyroxine 50 mcg daily, losartan 100 mg daily, NovoLog 70/30 at 14 units 2 times a day, pantoprazole 40 mg 2 times a day, potassium chloride 10 mEq daily, Zyrtec 10 mg daily.   SOCIAL HISTORY:  Lives by herself. Remote tobacco history. No alcohol or drug use.   FAMILY HISTORY:  Diabetes, sister with arrhythmia and another sister with dementia and cancer.   REVIEW OF SYSTEMS:  CONSTITUTIONAL: No fever. Positive for fatigue and weakness. No weight changes.  EYES: History of cataracts.  ENT: Has some chronic rhinorrhea.  RESPIRATORY: Positive for dry cough, shortness of breath, dyspnea on exertion. No history of COPD.  CARDIOVASCULAR: No chest pain. Positive for puffy hands and feet, dyspnea on exertion and elevated blood pressure.  GASTROINTESTINAL: Has off and on chronic diarrhea. No nausea or abdominal pain. No dark stools.  GENITOURINARY: Denies dysuria, hematuria.  HEMATOLOGIC/LYMPHATICS: Has a history of chronic anemia.  SKIN: No new rashes.  MUSCULOSKELETAL: Denies arthritis.  NEUROLOGICAL: History of acoustic neuroma with right-sided cranial nerve VII deficit.  PSYCHIATRIC: Has insomnia.   PHYSICAL EXAMINATION: VITAL SIGNS: Temperature 98, pulse 82, respiratory rate 24 on arrival, blood pressure 143/69 and oxygen saturation with ambulation 97%. On rest, it is 96% on room air.  GENERAL: The patient is a well-developed Caucasian elderly lady lying in bed in no obvious distress.  HEENT: Normocephalic, atraumatic. Pupils are equal and reactive. She has a right-sided facial droop and cranial nerve VII palsy. Moist mucous membranes.  NECK: Supple. No thyroid tenderness. No cervical lymphadenopathy. Positive for JVD and hepatojugular reflux.  CARDIOVASCULAR: Regular rate and rhythm.  S1, S2. Positive for murmur at the base of the heart.  LUNGS: Good air entry. There are bilateral crackles at the bases.  ABDOMEN: Soft, nontender, nondistended.  Positive bowel sounds.  EXTREMITIES: No significant lower extremity edema. Hands appear to be somewhat edematous.  SKIN: No obvious rashes.  NEUROLOGIC: Right-sided cranial nerve VII palsy, decreased sensation on the right subjective. Strength is 5/5 in all other extremities. Sensation is otherwise intact to light touch. No obvious other rashes.   DIAGNOSTIC DATA:  Glucose is 77, BNP 2248, BUN 58, creatinine 2.33 (in February 2012 it was 1.56 and in January 2012 it was 2.17), sodium 141, potassium 4.3, GFR 18. WBC is 10.7, hemoglobin 8.9, platelets 342, MCV is 75. EKG: Accelerated junctional rate of 78, no acute ST elevation or depression. X-ray of the chest: Findings of pulmonary venous hypertension with suggestion of mild interstitial edema.   ASSESSMENT AND PLAN:  We have an 81 year old Caucasian female who lives by herself with multiple medical problems including chronic renal failure, chronic systolic congestive heart failure, obstructive sleep apnea, hypertension, diabetes, progressive shortness of breath and dyspnea on exertion, elevated BNP and crackles in the lungs with hypoxemia of 87% on room air with ambulation. At this point, we will admit her to telemetry with a diagnosis of acute hypoxic respiratory failure in the setting of acute on chronic systolic congestive heart failure. She appears to be somewhat volume overloaded based on exam, but has no significant lower extremity edema, however. She has crackles, elevation of BNP and x-ray suggesting congestive heart failure. She has no fever or leukocytosis to suggest she is having pneumonia. She has received a dose of Lasix and will be gentle with Lasix given renal failure, acute on chronic. We will monitor ins and outs, watch her on the telemetry, do daily weights, supply oxygen and obtain an echocardiogram as we do not have an echocardiogram recently on file. We will also obtain a cardiology consult. The patient of note has refused to see  cardiology in the past per her daughter who is in the room. I will hold her losartan given renal failure at this point and see what her ejection fraction is. She usually has chronic renal failure, stage IV, and currently is stage III and therefore, we will be gentle with diuresis. There is some chronic anemia. Her MCV appears to be lower than her baseline; however, she denies acute gastrointestinal bleed, dark tarry stools or melena. We will check her iron panel and check a stool guaiac. We will obtain a physical therapy consult for weakness. We will check a hemoglobin A1c and resume her outpatient diabetes regimen. We will continue her thyroid medicine and check a TSH. We will start her on heparin for deep vein thrombosis prophylaxis and resume her Protonix.   CODE STATUS:  Full code.   TOTAL TIME SPENT:  60 minutes.    ____________________________ Krystal EatonShayiq Naria Abbey, MD sa:si D: 01/24/2013 17:35:28 ET T: 01/24/2013 18:11:45 ET JOB#: [id number]  cc: Krystal EatonShayiq Kiah Keay, MD, <Dictator> Lynnea FerrierBert J. Klein III, MD Krystal EatonSHAYIQ Sabastian Raimondi MD ELECTRONICALLY SIGNED 02/26/2013 13:06

## 2015-04-11 NOTE — Discharge Summary (Signed)
PATIENT NAME:  Patricia Cameron, Jamela MR#:  [id number] DATE OF BIRTH:  1921/01/28  DATE OF ADMISSION:  04/13/2013 DATE OF DISCHARGE:    FINAL DIAGNOSES: 1.  Acute on chronic right-sided systolic congestive heart failure.  2.  Chronic kidney disease, stage III, with some worsening secondary to dehydration while on Lasix.  3.  Acute gout flare.  4.  Chronic sleep apnea, for which she has refused CPAP treatments.  5.  Chronic obstructive pulmonary disease with acute exacerbation.  6.  Hypertension.  7.  Adult onset diabetes mellitus, uncontrolled.  8.  History of acoustic neuroma resection with chronic right facial droop secondary to this.  9.  Decreased acute visual acuity in the right eye second macular degeneration.  10.  Hypothyroidism.   HISTORY AND PHYSICAL: Please see dictated admission history and physical.   HOSPITAL COURSE: The patient was admitted with increasing shortness of breath and evidence of congestive heart failure. She had an echocardiogram done fairly recently which revealed LVEF of 55%, in a patient with chronic right heart failure secondary to her untreated sleep apnea. She was diuresed aggressively; actually diuretics had to be held after her BUN and creatinine began to rise. These did stabilize with the BUN at approximately 60 and creatinine at approximately 2.1. Diuretics will need to be continued every other day; however, her renal function will need to be watched closely to try to balance her fluid status and renal function, and the family members were aware of the challenge of this.   She complained of significant foot pain, and was noticed to have redness of the first MTP joint of the right foot. Podiatry saw the patient. She underwent injection for what appeared to be acute gout flare. She had been on allopurinol and the dose was increased. She was placed on prednisone taper for this as well as for some wheezing and some concern of exacerbation of COPD. She cannot use  inhalers secondary to inability to seal her mouth. SVNs were used as needed, her breathing actually improved enough that this was not needed on a standing basis. She is going on prednisone, however, with slow taper anticipated to try to help with gout.   She continued to complain of pain in both feet, though there was no evidence of gout flare in the left foot. This altered her ambulation and make her balance worse when physical therapy worked with the patient. Because of this and her continued debility, it is felt that she would be better served by going to a skilled nursing facility and then for transition to assisted living if she tolerates this.   At this time, she will be discharged to a nursing facility in stable condition. She should be weighed daily with physician called for more than 2 pounds gain in one day or 5 pounds in one week or increasing signs or symptoms of heart failure. Her diet should be no added salt, no concentrated sweets. She will need sugars checked 4 times a day. Cover with sliding scale.  Physical therapy should evaluate and treat the patient and she will need that to be in one week with results sent to the nursing home physician. She will be continued on nasal cannula at 2 liters, hydrated.   DISCHARGE MEDICATIONS: 1.  Amlodipine 10 mg p.o. b.i.d.  2.  Levothyroxine 0.05 mg p.o. daily.  3.  Losartan 100 mg p.o. daily.  4.  Imdur 30 mg p.o. daily.  4.  Ropinirole 0.25 mg p.o. at bedtime.  5.  Pantoprazole 40 mg p.o. b.i.d.  6.  Fexofenadine 100 mg p.o. daily.  7.  Iron 45 mg p.o. daily.  8.  Loperamide 2 mg every 3 hours as needed for diarrhea.  9.  Maalox 30 mL p.o. 4 times a day as needed for indigestion.  10.  Prednisone taper starting at 50 mg daily, decreasing x 10 mg every 2 days.  11.  Regular insulin by sliding scale.  12. Allopurinol 200 mg p.o. daily.  13.  Insulin 70/30, 16 units subcutaneous b.i.d.  14.  DuoNeb SVN to be used every 6 hours as needed for  wheezing.  15.  Furosemide 40 mg p.o. every other day.  16.  Potassium 10 mEq p.o. every other day with Lasix.   CODE STATUS: The patient is DO NOT RESUSCITATE.   Time Spent ON discharge: 45 minutes.    ____________________________ Lynnea Ferrier, MD bjk:ct D: 04/18/2013 08:11:20 ET T: 04/18/2013 08:30:27 ET JOB#: [id number]  cc: Lynnea Ferrier, MD, <Dictator> Rhona Raider Troxler, DPM Daniel Nones MD ELECTRONICALLY SIGNED 04/18/2013 12:49

## 2015-04-11 NOTE — Consult Note (Signed)
Present Illness This is an 81 year old female with  history of right-sided diastolic congestive heart failure, chronic kidney disease, sleep apnea, COPD, hypertension and diabetes mellitus who presents from nursing home with complaints of chest pain. She is a difficult historian but complained of mid sternal chest pain and shortness of breth. She had a mild tropoinin elevation. She also has chornic CKD with GFR of 15-19. SHe has improved since admission and denies chest pain at present.She has a hisgtory of sleep apnea but defers cpap.   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   NECK supple   RESP no use of accessory muscles  rhonchi   CARD Regular rate and rhythm  Murmur   Murmur Systolic   Systolic Murmur axilla   ABD denies tenderness  normal BS   LYMPH negative neck   EXTR negative cyanosis/clubbing, negative edema   SKIN normal to palpation   NEURO right facial droop   PSYCH poor insight   Review of Systems:  Subjective/Chief Complaint shortness of breath and chest pain   General: Fatigue   Skin: No Complaints   Eyes: No Complaints   Neck: No Complaints   Respiratory: Short of breath   Cardiovascular: Chest pain or discomfort   Gastrointestinal: No Complaints   Genitourinary: No Complaints   Vascular: No Complaints   Musculoskeletal: right facial droop   Neurologic: acoustic neuroma with right facial droop   Hematologic: No Complaints   Endocrine: No Complaints   Psychiatric: No Complaints   Review of Systems: All other systems were reviewed and found to be negative   Medications/Allergies Reviewed Medications/Allergies reviewed   Home Medications: Medication Instructions Status  NovoLog Mix 70/30 subcutaneous suspension 16 unit(s) subcutaneous 2 times a day (before meals)  Active  albuterol-ipratropium 2.5 mg-0.5 mg/3 mL inhalation solution  inhaled  Active  insulin  isophane-insulin regular 70 units-30 units/mL human recombinant subcutaneous suspension  subcutaneous  Active  allopurinol 100 mg oral tablet 1 tab(s) orally once a day Active  potassium chloride 10 mEq oral tablet, extended release 1 tab(s) orally every 48 hours Active  amlodipine 10 mg oral tablet 1 tab(s) orally 2 times a day (8 am, 8 pm) Active  levothyroxine 50 mcg (0.05 mg) oral tablet 1 tab(s) orally once a day for thyroid (6 am) Active  losartan 100 mg oral tablet 1 tab(s) orally once a day for hypertension (8 am) Active  isosorbide mononitrate 30 mg oral tablet, extended release 1 tab(s) orally once a day (8 am) **do not crush or chew** Active  rOPINIRole 0.25 mg oral tablet 1 tab(s) orally once a day (at bedtime) (8 pm) Active  pantoprazole 40 mg oral delayed release tablet 1 tab(s) orally 2 times a day (8 am, 8 pm) **do not crush or chew** Active  HumuLIN R human recombinant 100 units/mL injectable solution  injectable 4 times a day (before meals and at bedtime)  As Needed per sliding scale: 150-200 = 2 units, 201-250 = 4 units, 251-300 = 6 units, 301-350 = 8 units, 351-400 = 10 units, >401 or <70 = call MD Active  Slow Release Iron (as elemental iron) 45 mg oral tablet, extended release 1 tab(s) orally once a day (8 am) **do not crush or chew** Active  Maalox Advanced Maximum Strength 400 mg-400 mg-40 mg/5 mL oral suspension 30 milliliter(s) (2 tablespoonsful) orally up to 4 times a day, As Needed - for Indigestion, Heartburn Active  Proctosol-HC 2.5% rectal cream with applicator 1 application rectal 2 times a day Active  Phenergan 25 milligram(s) orally every 4 hours, As Needed - for Nausea, Vomiting Active  Tylenol 650 milligram(s) orally every 4 hours, As Needed - for Pain Active  GlucaGen 1 milligram(s) injectable , As Needed Active  fexofenadine 60 mg oral tablet 1 tab(s) orally once a day Active  torsemide 10 mg oral tablet 1 tab(s) orally once a day Active  Voltaren Topical 1%  topical gel Apply topically to affected area 4 times a day Active  loperamide 2 mg oral tablet 2 cap(s) orally , As Needed - for Diarrhea Active  Dextrose 50% injection 25 milligram(s) intravenous , As Needed Active   EKG:  EKG NSR   Abnormal NSSTTW changes    Ace Inhibitors: Cough  Vitamin D: GI Distress  Metformin: GI Distress  Minoxidil: Unknown  Hytrin: Unknown  Clonidine: Unknown   Impression 81 yo female with history of hypertension, ckd IV; Chronic diastollic heaert failure who was admitted with chest pain with no signficnat st t wave changes. Elevated troponin appears to be secondary to ischemia. Not a candidate for contgrast study to evaluate anatomy given risk of procedure with GFR of 15-19 and advanced. Age will need to attempt to treat medically with beta blockers asa and nitrates as tolerated. Ruled in for nstemi.   Plan 1. Metoprolol 25 bid 2. Continue with oral nitrates and asa 3. Not a candidate for invasive evaluation due to renal funciton 4 Further recs pending course   Electronic Signatures: Dalia HeadingFath, Laren Orama A (MD)  (Signed 22-Jul-14 22:29)  Authored: General Aspect/Present Illness, History and Physical Exam, Review of System, Home Medications, EKG , Allergies, Impression/Plan   Last Updated: 22-Jul-14 22:29 by Dalia HeadingFath, Willadene Mounsey A (MD)

## 2015-04-11 NOTE — H&P (Signed)
PATIENT NAME:  Patricia Cameron, Patricia Cameron MR#:  [id number] DATE OF BIRTH:  1921/04/05  DATE OF ADMISSION:  04/13/2013  PRIMARY CARE PHYSICIAN:  Dr. Daniel NonesBert Klein.   REFERRING PHYSICIAN:  Dr. Dorothea GlassmanPaul Malinda.   CHIEF COMPLAINT:  Shortness of breath x 1 month, getting worse and bilateral leg pain.   HISTORY OF PRESENT ILLNESS:  The patient is an 81 year old Caucasian female, nursing [facility], with history of hypertension, diabetes and obstructive sleep apnea.  She has refused CPAP treatment in the past, has history of right-sided systolic heart failure and history of hyperlipidemia.  The patient reports that over the last one month she is having shortness of breath mostly at night and gradually getting worse.  She also describes bilateral leg pain, especially her feet and some swelling.  Evaluation at the Emergency Department revealed O2 saturation of 85% on room air.  Chest x-ray was unremarkable except for some pulmonary venous congestion, but no effusion, no consolidation.  She has three troponins.  All of them were normal.  Her EKG is unremarkable.    REVIEW OF SYSTEMS:  CONSTITUTIONAL:  Denies having any fever.  No chills.  No fatigue.  EYES:  No blurring of vision.  No double vision.  She has no vision in the right eye.  This is an old finding.  EARS, NOSE, THROAT:  She has no hearing in the right ear.  This is an old finding due to acoustic neuroma.  No dizziness.  No dysphagia.  CARDIOVASCULAR:  No chest pain, but she has shortness of breath as described above.  No syncope.  RESPIRATORY:  No sputum production.  No hemoptysis, but she has shortness of breath.  GASTROINTESTINAL:  No abdominal pain.  No vomiting.  No diarrhea.  GENITOURINARY:  No dysuria.  No frequency of urination.  MUSCULOSKELETAL:  No joint swelling or pain other than the feet pain and there is some redness at the base of the right great toe.  No muscular pain or swelling other  than her leg pains.  INTEGUMENTARY:  No skin rash.  No ulcers.  NEUROLOGY:  No focal weakness.  No seizure activity.  No headache.  PSYCHIATRY:  No anxiety.  No depression.  ENDOCRINE:  No polyuria or polydipsia.  No heat or cold intolerance.   PAST MEDICAL HISTORY:  Obstructive sleep apnea syndrome.  She had refused CPAP treatment.  I noted in the previous admission that her oxygen saturation dropped to 87 on room air with ambulation.  History of systemic hypertension, history of right-sided systolic failure thought to be secondary to her sleep apnea, however her last echocardiogram in February 2014 did not show right ventricular failure, however there was suggestion for tricuspid regurgitation and moderate mitral regurgitation, left ventricular function, ejection fraction estimated at 55%.  There is also moderate right atrial enlargement.  Her history also includes, diabetes mellitus type 2, history of gout, history of hyperlipidemia, macular degeneration, cholelithiasis, hypothyroidism, history of acoustic neuroma resected.  As a result she has right facial nerve paralysis and she also lost the vision in the right eye.   PAST SURGICAL HISTORY:  Surgical removal of acoustic neuroma.  Left hip replacement, cataract surgery.   SOCIAL HABITS:  There is a remote history of tobacco abuse.  No history of alcohol, drug abuse.   SOCIAL HISTORY:  She is widowed.  [facility].   FAMILY HISTORY:  Her sister has dementia.  Another  sister has diabetes mellitus.   ADMISSION MEDICATIONS:  Acetaminophen 2 tablets, that is 650 mg q. 4 hours as needed, allopurinol 100 mg a day, amlodipine 10 mg, she takes that twice a day.  Fexofenadine 180 mg once a day, furosemide 40 mg a day, Humulin R insulin sliding scale.  Also, NovoLog mix 70/30 12 units in the morning and 14 units in the evening.  Isosorbide mononitrate 30 mg once a day, levothyroxine 50 mcg once a day, loperamide 2 mg  q. 3 hours as needed for diarrhea.  Losartan 100 mg once a day, Maalox as needed, milk of magnesia as needed, pantoprazole or Protonix 40 mg twice a day, potassium chloride 10 mEq once a day, ropinirole 0.25 mg once a day, slow release iron 45 mg once a day.   ALLERGIES:  ACE INHIBITOR CAUSING COUGH.  CLONIDINE, HYTRIN, METFORMIN CAUSING GI PROBLEMS.  MINOXIDIL AND VITAMIN D CAUSING GI DISTURBANCES.   PHYSICAL EXAMINATION: VITAL SIGNS:  Blood pressure 164/76, respiratory rate 30, pulse 70, temperature 98.6, oxygen saturation 98%, but this is on oxygen.  It was reported to me that the O2 sat on room air was 85%.  GENERAL APPEARANCE:  Elderly frail lady lying in bed in no acute distress.  She is slightly tachypneic.  HEAD AND NECK:  No pallor.  No icterus.  No cyanosis.  There is right facial droop.  Ear examination revealed the absence of hearing on the right side, slightly decreased hearing on the left.  No discharge, no ulcers.  Examination of the nose showed no discharge, no bleeding.  No ulcers.  Examination of the mouth showed no oral thrush.  Normal lips and tongue.  Eye examination revealed normal eyelids and conjunctivae.  Pupils about 4 mm, sluggishly reactive to light.  NECK:  Supple.  Trachea at midline.  No thyromegaly.  No masses.  HEART:  Revealed normal S1, S2.  No S3 or S4.  There is a soft systolic murmur at the left sternal border.  No carotid bruits.  RESPIRATORY:  Slightly tachypneic, was 30 per minute, but now it is about 20 to 24 per minute.  She is not using accessory muscles.  No rales.  No wheezing.  ABDOMEN:  Soft without tenderness.  No hepatosplenomegaly.  No masses.  No hernias.  SKIN:  Revealed no ulcers.  No subcutaneous nodules.  MUSCULOSKELETAL:  There is mild swelling of the right great toe at the base with a little redness and tenderness.  Not much of peripheral edema.  The patient already has TED stockings on.  NEUROLOGIC:  Cranial nerves II through XII are intact,  except for right facial droop.  No focal motor deficit.   PSYCHIATRIC:  The patient is alert, oriented to place and people.  Mood and affect were flat.   LABORATORY FINDINGS:  Her chest x-ray showed cardiomegaly and mild pulmonary venous congestion.  No effusion.  No consolidation.  X-ray of the right foot raised suspicion for possible tarsal navicular fracture.  Her serum glucose 147, B-type natriuretic peptide was 6281, BUN 39, creatinine 1.79.  Her baseline creatinine in February of this year was around 2.  Sodium 140, potassium 4, estimated GFR is 25.  Troponin less than 0.02 in three samples.  CBC showed white count of 9000, hemoglobin 11, hematocrit 35, platelet count 309.  EKG showed the basic rhythm is unclear, if it is atrial fibrillation or sinus.  There is baseline artifact and twitching.  The right is 77 per minute.  This needs to be repeated later.   ASSESSMENT: 1.  Shortness of breath.  Etiology is unclear, whether related to her pulmonary hypertension or new element of congestive heart failure.   2.  EKG rhythm is slightly irregular.  The baseline is unclear.  This needs to be repeated to make sure there is no underlying atrial fibrillation.  3.  Possible gout in the right foot.  4.  Possible fracture on the right foot at tarsal navicular area, needs further input with orthopedic.  5.  History of right-sided failure.  6.  Systemic hypertension.  7.  Diabetes mellitus type 2.  8.  Obstructive sleep apnea.  She had refused CPAP before.  9.  History of acoustic neuroma that was surgically removed.  10.  Chronic kidney disease stage 4.  11.  Right facial palsy.   PLAN:  We will admit the patient to the medical floor, oxygen supplementation.  The patient likely will need oxygen supplementation if the work-up is negative just to keep her O2 saturation at 90% or close.  Obtain Doppler ultrasound of lower extremities to make sure there is no deep vein thrombosis.  Obtain VQ scan to rule out  pulmonary embolism.  Orthopedic consultation to look at the right foot area to clarify whether there is a fracture or not.  Continue home medications as listed above.  Again, I will repeat the EKG in the morning to have some clarity about the type of the rhythm she has.  I spoke with the patient and I also discussed her clinical condition with her family, in particular the daughter and the grandson.  Her daughter's name is [name] and she confirmed that the CODE STATUS of the patient is DO NOT RESUSCITATE.   Time spent in evaluating this patient took more than 1 hour and 15 minutes.     ____________________________ Carney Corners. Rudene Re, MD amd:ea D: 04/13/2013 02:12:30 ET T: 04/13/2013 02:37:38 ET JOB#: [id number]  cc: Carney Corners. Rudene Re, MD, <Dictator> Karolee Ohs Dala Dock MD ELECTRONICALLY SIGNED 05/13/2013 23:30

## 2015-04-11 NOTE — Consult Note (Signed)
EGD showed few small duodenal AVM's, which were cauterized. This could mean more AVM's further down the small intestine, which cannot be reached. Colonoscopy showed diffuse left sided tics, large sigmoid lipoma, and large internal hemorrhoids. Would treat hemorrhoids. High fiber diet. Thanks.  Electronic Signatures: Lutricia Feilh, Briant Angelillo (MD)  (Signed on 10-Feb-14 13:19)  Authored  Last Updated: 10-Feb-14 13:19 by Lutricia Feilh, Shaletha Humble (MD)

## 2015-04-11 NOTE — Consult Note (Signed)
Brief Consult Note: Diagnosis: IDA felt to be secondary to chronic blood loss.  Personal history of colonic polyps, adenomatous.  Noted colonoscopy report 2006.  EGD at this time revealed finding of esophageal ulcer as well as erosions to gastrium.  Acute on chronic renal insufficiency.  CHF, right sided. Reflux not well controlled for the past year.   Discussed with Attending MD.   Comments: Patient's presentation will be discussed with Dr. Lutricia FeilPaul Oh.  Discussed in great length with patient the recommendation of proceeding with endoscopy evaluation inclusive of EGD as well as diagnostic colonoscopy for the indications of IDA, personal history of colonic polyps, adenomatous and reflux.  Procedures, risks versus benefits discussed.  Patient wishes to speak with her daughter first prior to consenting to proceed forward in this manner.  Will continue to monitor.  Dr. Bluford Kaufmannh will readdress procedures with patient have she has spoken with her daughter.  Electronic Signatures: Rodman KeyHarrison, Dawn S (NP)  (Signed 07-Feb-14 12:10)  Authored: Brief Consult Note   Last Updated: 07-Feb-14 12:10 by Rodman KeyHarrison, Dawn S (NP)

## 2015-04-11 NOTE — H&P (Signed)
PATIENT NAME:  Patricia Cameron, Patricia Cameron MR#:  [id number] DATE OF BIRTH:  Aug 20, 1921  DATE OF ADMISSION:  07/10/2013  REFERRING PHYSICIAN: Dr. Bayard Males.  PRIMARY CARE PHYSICIAN: Dr. Daniel Nones.  CHIEF COMPLAINT: Chest pain.   HISTORY OF PRESENT ILLNESS: This is an 81 year old female with significant past medical history of right-sided diastolic congestive heart failure, chronic kidney disease, sleep apnea, COPD, hypertension and diabetes mellitus who presents from nursing home with complaints of chest pain. The patient reports chest pain was left-sided, nonradiating, started at rest, was continuous, resolved in ED after she received sublingual nitroglycerin and aspirin. Reports it was accompanied by some mild shortness of breath. She reports this shortness of breath has been going on for 2 days as well, it was accompanied with cough which was nonproductive, denies any sweating, diaphoresis or palpitations. In the ED, the patient had a troponin negative x1. Her EKG did not show any acute findings. The patient was recently admitted at [facility] on April 30th with a diagnosis of acute exacerbation of diastolic CHF, but she does not have any worsening lower extremity edema or any vascular congestion on her chest x-ray. Hospitalist service was requested to admit the patient for further management and work-up of her chest pain. The patient's chest x-ray did not show any opacity or infiltrate. The patient was afebrile, did not have any leukocytosis. The patient is known to have history of chronic kidney disease with baseline around 2.5, where today it seems to be around her baseline. The patient denies any poor p.o. intake or any diarrhea, dysuria, or constipation.   PAST MEDICAL HISTORY: 1.  Obstructive sleep apnea, refusing CPAP.  2.  Hypertension.  3.  Heart failure, right-sided, diastolic.  4.  Diabetes mellitus, on insulin.  5.  Gout.  6.  Hyperlipidemia.  7.  Macular degeneration.  8.   Cholelithiasis. 9.  Hypothyroidism.  10.  History of acoustic neuroma resected with resultant right facial nerve paralysis as well as lost vision in the right eye.   PAST SURGICAL HISTORY:  1.  Surgical removal of acoustic neuroma.  2.  Left hip replacement.  3.  Cataract surgery.   SOCIAL HISTORY: The patient has remote history of tobacco abuse. No history of alcohol or drug abuse. Currently she is at an assisted living facility.   FAMILY HISTORY: Her sister has dementia and diabetes mellitus.   ALLERGIES:  1.  ACE INHIBITOR CAUSING COUGH.  2.  CLONIDINE.  3.  HYTRIN. 4.  METFORMIN.  5.  MINOXIDIL.  6.  VITAMIN D.   HOME MEDICATIONS: 1.  Tylenol as needed.  2.  Losartan 100 mg oral daily.  3.  Maalox as needed.  4.  Isosorbide mononitrate 30 mg oral daily.  5.  Humulin insulin sliding scale.  6.  NovoLog Mix 70/30 16 units b.i.d.  7.  Loperamide 2 mg oral 2 capsules as needed.  8.  Finnegan as needed.  9.  Allopurinol 100 mg oral daily.  10.  Fexofenadine 60 mg oral daily.  11.  Ropinirole 0.25 mg oral at bedtime.  12.  Allopurinol/ipratropium nebulizer as needed.  13.  Norvasc 10 mg p.o. b.i.d.  14.  Voltaren gel as needed.  15.  Torsemide 10 mg oral daily.  16.  P.r.n. dextrose 50 injection.  17.  Glucagon IM injection, p.r.n.  18.  Slow-release iron 45 mg oral 1 tablet daily.  19.  Potassium chloride 10 mEq oral daily.  20.  Levothyroxine 50 mcg oral daily.  21.  Pantoprazole 40 mg p.o. b.i.d.    REVIEW OF SYSTEMS:   GENERAL:  The patient denies fever or chills, weight gain, weight loss, fatigue, weakness.  EYES: The patient denies blurry vision, double vision. The patient has vision loss in the right eye.  Denies inflammation or glaucoma.  ENT: The patient is extremely difficult of hearing, cannot hear on the right side. Denies any tinnitus, ear pain, epistaxis or discharge.  RESPIRATORY: Complains of cough, nonproductive. Denies hemoptysis, painful respirations,  reports history of COPD and mild dyspnea.  CARDIOVASCULAR: Had chest pain, currently resolved. Denies edema, palpitations, syncope.  GASTROINTESTINAL: Denies nausea, vomiting, diarrhea, abdominal pain, hematemesis, melena.  GENITOURINARY: Denies dysuria, hematuria, or renal colic.  ENDOCRINE: Denies polyuria, polydipsia, heat or cold intolerance, polyuria. Denies any easy bruising, or bleeding . INTEGUMENTARY: Denies acne, rash or skin lesions.  MUSCULOSKELETAL: History of gout and arthritis. Denies any cramps.   NEUROLOGIC: Has chronic right facial droop. Denies any CVA, TIA, epilepsy, tremors, vertigo.  PSYCHIATRIC: Denies anxiety, insomnia, bipolar disorder or schizophrenia.   PHYSICAL EXAMINATION: VITAL SIGNS: Temperature 97.9, pulse 72, respiratory rate 26, blood pressure 150/52, saturating 97% on 2 liters nasal cannula.  GENERAL: Frail, elderly female looks comfortable in bed in no apparent distress.  HEENT: Head atraumatic, has right fascial droop. Left eye pink conjunctivae, anicteric sclerae. Moist oral mucosa. Pupils reactive to light and accommodation. Right eye has ptosis.    NECK: Supple. No thyromegaly. No JVD. No carotid bruits.  CHEST: Good air entry bilaterally. No wheezing, rales, rhonchi.  CARDIOVASCULAR: S1, S2 heard. No rubs, murmurs, gallops.  ABDOMEN: Soft, nontender, nondistended. Bowel sounds present.  EXTREMITIES: +1 edema bilaterally. No clubbing. No cyanosis. Pedal pulses felt bilaterally +2.  PSYCHIATRIC: Appropriate affect. Awake, alert x3. Intact judgment and insight.  SKIN: Normal skin turgor. Warm and dry.  NEUROLOGIC: Has right facial droop. Motor 5 out of 5.  No focal deficits. Sensation symmetrical and intact to light touch.  LYMPHATICS: Has no cervical or axillary lymphadenopathy.   PERTINENT LABS:  Glucose 274, BUN 58, creatinine 2.47, sodium 137, potassium 3.9, chloride 102, CO2 25, AST 19, ALT 16, alkaline phosphatase 104. White blood cells 12.2,  hemoglobin 10.8, hematocrit 33.1, platelets 201. Troponin less than 0.05. CK total 73. Urinalysis showing 21 white blood cells and leukocyte esterase +3 and +1 bacteria. EKG showing normal sinus rhythm with first-degree AV block at 89 beats per minute with no changes from last EKG in April of this year.   ASSESSMENT AND PLAN: 1.  Chest pain. The patient had complaints of chest pain, which resolved with sublingual nitroglycerin after receiving aspirin, does not have previous history of coronary artery disease, but given the fact she had her pain resolved with nitroglycerin, she will be admitted.  We will have her on telemetry unit. We will continue to cycle her cardiac enzymes and will request Cardiology consult to see if there is any further work-up indicated at this point.  2.  Urinary tract infection. The patient will be started on Rocephin.  3.  Chronic kidney disease. Appears to be stable. Will monitor closely. We will hold the patient's torsemide and will have her on very gentle hydration. Will give her a total of 500 mL at 50 mL per hour.  4.  Hypothyroidism. Continue with Synthroid.  5.  Gout. Continue with allopurinol.  6.  Diabetes mellitus. We will continue her on NovoLog 70/30 and we will have her on insulin sliding scale.  7.  History  of right-sided heart failure. Appears to be compensated.  8.  Hypertension: Acceptable. Continue with home meds.  9.  Deep vein thrombosis prophylaxis, subcutaneous heparin.  Gastrointestinal prophylaxis on Protonix.   CODE STATUS: FULL CODE.   Total time spent on admission and patient care: 55 minutes.    ____________________________ Starleen Arms, MD dse:dp D: 07/10/2013 03:14:45 ET T: 07/10/2013 07:11:08 ET JOB#: [id number]  cc: Starleen Arms, MD, <Dictator> Mariadel Mruk Teena Irani MD ELECTRONICALLY SIGNED 07/20/2013 7:37

## 2015-04-11 NOTE — Consult Note (Signed)
Chief Complaint:   Subjective/Chief Complaint Fe def anemia. Breathing slowly improving.   VITAL SIGNS/ANCILLARY NOTES: **Vital Signs.:   08-Feb-14 07:38   Vital Signs Type Routine   Temperature Temperature (F) 98.4   Celsius 36.8   Pulse Pulse 74   Respirations Respirations 20   Systolic BP Systolic BP 103   Diastolic BP (mmHg) Diastolic BP (mmHg) 74   Mean BP 93   Pulse Ox % Pulse Ox % 94   Pulse Ox Activity Level  At rest   Oxygen Delivery 2L   Brief Assessment:   Cardiac Regular    Respiratory clear BS    Gastrointestinal Normal   Lab Results: Routine Chem:  08-Feb-14 04:41    Glucose, Serum  129   BUN  46   Creatinine (comp)  2.14   Sodium, Serum 143   Potassium, Serum 4.7   Chloride, Serum  108   CO2, Serum 28   Calcium (Total), Serum  8.4   Anion Gap 7   Osmolality (calc) 299   eGFR (African American)  23   eGFR (Non-African American)  20 (eGFR values <94m/min/1.73 m2 may be an indication of chronic kidney disease (CKD). Calculated eGFR is useful in patients with stable renal function. The eGFR calculation will not be reliable in acutely ill patients when serum creatinine is changing rapidly. It is not useful in  patients on dialysis. The eGFR calculation may not be applicable to patients at the low and high extremes of body sizes, pregnant women, and vegetarians.)  Routine Hem:  08-Feb-14 04:41    Hematocrit (CBC)  29.9 (Result(s) reported on 27 Jan 2013 at 05:00AM.)   Assessment/Plan:  Assessment/Plan:   Assessment Fe def anemia.    Plan Will change to clear liquid diet tomorrow so we can bowel prep her tomorrow afternoon. Thanks.   Electronic Signatures: OVerdie Shire(MD)  (Signed 08-Feb-14 09:17)  Authored: Chief Complaint, VITAL SIGNS/ANCILLARY NOTES, Brief Assessment, Lab Results, Assessment/Plan   Last Updated: 08-Feb-14 09:17 by OVerdie Shire(MD)

## 2015-04-11 NOTE — Consult Note (Signed)
General Aspect 81 year old female with history of hypertension, diabetes mellitus, hyperlipidemia, right-sided heart failure, untreated sleep apnea,  that presented with increasing shortness of breath over the last several days.  Patient had noticed at home that she had had 3 pound fluid gain in one week.  She also noticed her hands were swollen.  She also felt uncomfortable in the abdominal area.  Patient had been newly diagnosed with anemia by Dr. Caryl Comes in December with patient deferring any treatment for same.  She now has a hemoglobin of 8.4, renal sufficiency has worsened with a creatinine of 2.33.Chest x-ray revealed pulmonary hypertension suggesting mild pulmonary interstitial edema.  Patient did not seem to be significantly fluid overloaded on admission.  An echocardiogram is pending with previous echo showing preserved LV function, left ventricle hypertrophy, pulmonary hypertension.  Patient also had an mildly elevated troponin at 0.06 probably secondary to increase in myocardial demand due to anemia and renal insufficiency, not acute coronary syndrome . She denies any recent exertional chest pain. The patient currently  is being transfused.  She states that she does feel better.Marland KitchenBNP is elevated but  is more of a chronic issue due to structural abnormalities.   Physical Exam:   GEN well developed, no acute distress    HEENT hearing intact to voice, moist oral mucosa    RESP normal resp effort  clear BS    CARD Regular rate and rhythm  Normal, S1, S2  No murmur    ABD denies tenderness  soft    EXTR negative edema    SKIN skin turgor decreased    NEURO cranial nerves intact, motor/sensory function intact    PSYCH alert, A+O to time, place, person, poor insight   Review of Systems:   Subjective/Chief Complaint None at present    General: Fatigue    Skin: recent hand swelling    Respiratory: Short of breath    Cardiovascular: No Complaints    Vascular: Leg cramping  leg  cramping during night, today improved    Review of Systems: All other systems were reviewed and found to be negative   Lab Results: Thyroid:  06-Feb-14 04:57    Thyroid Stimulating Hormone 1.38 (0.45-4.50 (International Unit)  ----------------------- Pregnant patients have  different reference  ranges for TSH:  - - - - - - - - - -  Pregnant, first trimetser:  0.36 - 2.50 uIU/mL)  Routine Chem:  05-Feb-14 11:53    Iron Binding Capacity (TIBC)  463   Unbound Iron Binding Capacity 420   Iron, Serum  43   Iron Saturation 9 (Result(s) reported on 24 Jan 2013 at 06:04PM.)   Ferritin [facility]) 10 (Result(s) reported on 24 Jan 2013 at 06:20PM.)   B-Type Natriuretic Peptide [facility])  2248 (Result(s) reported on 24 Jan 2013 at 12:20PM.)   Glucose, Serum 77   BUN  58   Creatinine (comp)  2.33   Sodium, Serum 141   Potassium, Serum 4.3   Chloride, Serum 107   CO2, Serum 25   Calcium (Total), Serum 8.7   Anion Gap 9   Osmolality (calc) 296   eGFR (African American)  21   eGFR (Non-African American)  18 (eGFR values <69m/min/1.73 m2 may be an indication of chronic kidney disease (CKD). Calculated eGFR is useful in patients with stable renal function. The eGFR calculation will not be reliable in acutely ill patients when serum creatinine is changing rapidly. It is not useful in  patients on dialysis. The eGFR  calculation may not be applicable to patients at the low and high extremes of body sizes, pregnant women, and vegetarians.)  06-Feb-14 04:57    Magnesium, Serum 2.1 (1.8-2.4 THERAPEUTIC RANGE: 4-7 mg/dL TOXIC: > 10 mg/dL  -----------------------)   Hemoglobin A1c ([facility])  6.4 (The American Diabetes Association recommends that a primary goal of therapy should be <7% and that physicians should reevaluate the treatment regimen in patients with HbA1c values consistently >8%.)  Routine Hem:  05-Feb-14 11:53    WBC (CBC) 10.7   RBC (CBC)  3.76   Hemoglobin (CBC)  8.9   Hematocrit  (CBC)  28.4   Platelet Count (CBC) 342 (Result(s) reported on 24 Jan 2013 at 12:14PM.)   MCV  75   MCH  23.7   MCHC  31.4   RDW  18.4  06-Feb-14 04:57    WBC (CBC) 9.2   RBC (CBC)  3.50   Hemoglobin (CBC)  8.4   Hematocrit (CBC)  26.4   Platelet Count (CBC) 291   MCV  75   MCH  24.0   MCHC  31.8   RDW  18.3   Neutrophil % 80.3   Lymphocyte % 9.5   Monocyte % 8.6   Eosinophil % 0.9   Basophil % 0.7   Neutrophil #  7.4   Lymphocyte #  0.9   Monocyte # 0.8   Eosinophil # 0.1   Basophil # 0.1 (Result(s) reported on 25 Jan 2013 at 05:43AM.)    Ace Inhibitors: Cough  Vitamin D: GI Distress  Metformin: GI Distress  Minoxidil: Unknown  Hytrin: Unknown  Clonidine: Unknown  Vital Signs/Nurse's Notes: **Vital Signs.:   06-Feb-14 11:45   Vital Signs Type 15 min Post Blood Start Time   Celsius 36.8   Temperature Source oral   Pulse Pulse 66   Respirations Respirations 18   Systolic BP Systolic BP 855   Diastolic BP (mmHg) Diastolic BP (mmHg) 56   Mean BP 80   Pulse Ox % Pulse Ox % 95   Oxygen Delivery 2L     Impression 81 year old female with history of right-sided heart failure due to untreated sleep apnea with increasing dyspnea, newly diagnosed iron deficiency anemia, stage IV CKD, with congestive heart failure symptoms, probably driven  by new anemia and worsening  renal function.    Plan 1.  Continue current course of therapy addressing anemia, renal insufficiency. 2.  Surface echocardiogram pending.  Further recommendations per Dr. Nehemiah Massed when the results of the echo are known.  Patient was seen in collaboration with Dr. Nehemiah Massed.   Electronic Signatures: Roderic Palau (NP)  (Signed 06-Feb-14 15:21)  Authored: General Aspect/Present Illness, History and Physical Exam, Review of System, Labs, Allergies, Vital Signs/Nurse's Notes, Impression/Plan   Last Updated: 06-Feb-14 15:21 by Roderic Palau (NP)

## 2015-04-12 NOTE — H&P (Signed)
PATIENT NAME:  Patricia Cameron, Haileyann MR#:  [id number] DATE OF BIRTH:  [birth year]  DATE OF ADMISSION:  07/05/2014  PRIMARY CARE PHYSICIAN: Dr. Daniel NonesBert Klein.   REFERRING PHYSICIAN: Dr. Daryel NovemberJonathan Williams.   CHIEF COMPLAINT: Fall.  HISTORY OF PRESENT ILLNESS: A 81 year old female with history of obstructive sleep apnea refusing to use CPAP, hypertension, congestive diastolic heart failure, diabetes, gout, hyperlipidemia, hypothyroidism, surgery with some right facial  paralysis, who [facility] and had a fall today, over there. After that, she broke her right hip, and so brought to the Emergency Room for further evaluation. The ER physician gave her to the medical team, as she has multiple medical issues for admission, and orthopedic team needs to do surgery for her fracture. The patient denies any loss of consciousness or seizures. She has chronic diarrhea, and she takes Imodium as needed for that. No change in her symptoms or status as per daughter, who also is present in the room. She has had frequent falls and imbalance over there for the last few months.  PAST MEDICAL HISTORY: 1. Obstructive sleep apnea refusing CPAP.  2. Hypertension.  3. Heart failure, right-sided diastolic.  4. Non-ST-elevation MI in August 2014. Was admitted to the hospital, but no cardiology work-up was done because of her old age and chronic renal failure issues.  5. Chronic renal failure.  6. Diabetes mellitus on insulin.  7. Gout.  8. Hyperlipidemia.  9. Macular degeneration.  10. Cholelithiasis.   11. Hypothyroidism.  12. 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   PAST SURGICAL HISTORY: Surgical removal of acoustic neuroma, left hip replacement surgery, and cataract surgery.   SOCIAL HISTORY: The patient has remote history of tobacco. No history of alcohol or drug abuse. Lives at a nursing home.   FAMILY HISTORY: Sister has  dementia and diabetes mellitus.  ALLERGIES:  1. ACE INHIBITOR CAUSING COUGH. 2. CLONIDINE. 4. METFORMIN.  5. MINOXIDIL.  6. VITAMIN D.   CURRENT HOME MEDICATIONS: Still needs to be confirmed by the pharmacist, but as per the record, it shows:  1. Torsemide 10 mg oral tablet once a day. 2. Iron slow release tablet every 8 hours.  3. Ropinirole 0.25 mg oral once a day.  4. Potassium chloride 10 mEq tablet every other day.  5. Pantoprazole 40 mg delayed-release 2 times a day.  6. NovoLog mix 70/30, 16 units subcutaneous 2 times a day before meals.  7. Nitroglycerin 0.4 mg sublingual tablet once a day as needed for chest pain.  8. Metoprolol 25 mg oral 2 times a day.  9. Maalox 4 times a day as needed for indigestion.  10. Losartan 100 mg oral tablet once a day.  11. Loperamide 2 mg oral tablet 2 capsules as needed for diarrhea.  12. Levothyroxine 50 mcg oral tablet once a day.  13. Isosorbide mononitrate 30 mg oral tablet once a day.  14. Humulin injectable 4 times a day as needed for sliding scale coverage.  15. Fexofenadine 60 mg oral tablet once a day.  16. Ciprofloxacin 250 mg oral tablet every 12 hours for 5 days, was receiving in the past.  17. Atorvastatin 20 mg oral once a day.  18. Aspirin 81 mg once a day.  19. Amlodipine 10 mg oral once a day.  20. Allopurinol 100 mg oral once a day.  21. Albuterol ipratropium 3 mL inhalation every 6 hours as needed for wheezing.  PHYSICAL EXAMINATION:  VITAL SIGNS: In the ER, temperature 97.5, pulse is 70, respiration is 22, blood pressure 184/66, and pulse oximetry is 92 on oxygen supplementation 2 to 3 liters.  GENERAL: The patient is alert, but not very oriented, happily demented, but participating in communication and talking, has some hearing deficit.  HEENT: Head and neck atraumatic. Right-sided facial weakness.  NECK: Supple. No JVD.  RESPIRATORY: Bilateral equal and clear air entry.  CARDIOVASCULAR: S1, S2 present. murmur  present, regular rhythm.  ABDOMEN: Soft, nontender. Bowel sounds present. No organomegaly.  SKIN: No rashes.  EXTREMITIES: Legs, no edema. Right hip tenderness.  NEUROLOGICAL: Power 4/5 in all limbs except right lower limb, which she is not morning due to severe pain.  PSYCHIATRIC: Does not appear in any acute psychiatric illness, but appears to be having some dementia, so unable to judge.   IMPORTANT LABORATORY RESULTS: Echocardiogram in February 2014, shows left ventricular systolic function, normal 55% ejection fraction, moderate concentric left ventricular hypertrophy, left atrium moderately dilated, right atrium moderately dilated, moderate mitral regurgitation, moderate tricuspid regurgitation.   Glucose 161, BUN 44, creatinine 2.17. Sodium is 139, potassium is 5.1, chloride is 108, and CO2 is 28. Calcium is 8.5. Total protein 6.9. Albumin is 3.4, bilirubin is 0.2, alkaline phosphatase , SGOT 30 and SGPT is 23. WBC 9.7, hemoglobin is 9.4, platelet count is 226,000, and MCV is 99. Urinalysis is grossly negative.   X-ray shows right hip fracture.   Chest x-ray, mild congestion. No acute changes.   ASSESSMENT AND PLAN: A 81 year old female with history of COPD, congestive heart failure diastolic, hypertension, diabetes, hyperlipidemia, hypothyroid, presented to the Emergency Room after having a fall at the nursing home, and admitted to Medicine for multiple issues.   1. Right hip fracture. Plan per the orthopedic team. Suggest to keep on anticoagulation and pain management, but to keep the pain medication as low as possible because of old age and multiple respiratory and cardiac issues. Because of her multiple medical issues and old age, she is moderate to high risk for this surgery of hip, but does not require any further intervention they are limited because of renal failure and old age. Will optimize medically for surgery possibly tomorrow, and the orthopedic team can proceed with surgery with  keeping perioperative and postoperative IV fluids in control and pain medications in control because she is moderate to high risk for worsening of cardiac or respiratory status during or after surgery. We will continue following medically in the perioperative and postoperative period, also.  2. Chronic obstructive pulmonary disease. Currently there is no active wheezing. Continue oxygen supplementation and DuoNeb nebulizer.  3. Diastolic congestive heart failure. Currently. Currently, does not appear to be in fluid overload and advised to keep IV fluids and check during surgery. We will continue monitoring.  4. Coronary artery disease. She had elevated troponin last year's admission, and Cardiology saw her and did not do any further invasive workup because of renal failure, but suggested to continue medication. We will continue aspirin and statin for now, and advised to proceed with surgery because the benefit of surgery is more. Discussed with the family about her being a moderate to high risk from the cardiac and respiratory point of view. They understand and agree to proceed with surgery.  5. Diabetes. We will put her on sliding scale coverage currently.  6. Hypertension. Continue metoprolol, losartan, and will add hydralazine if needed.     CODE STATUS: DNR.   Discussed with  patient's daughter, who is present in the room.  TOTAL TIME SPENT ON THIS ADMISSION: 50 minutes.    ____________________________ Hope Pigeon Elisabeth Pigeon, MD [id number] D: 07/05/2014 17:12:00 ET T: 07/05/2014 18:55:38 ET JOB#: [id number]  cc: Hope Pigeon. Elisabeth Pigeon, MD, <Dictator> Lynnea Ferrier, MD  Altamese Dilling MD ELECTRONICALLY SIGNED 07/16/2014 16:16

## 2015-04-12 NOTE — Discharge Summary (Signed)
PATIENT NAME:  Patricia Cameron, Patricia Cameron MR#:  [id number] DATE OF BIRTH:  11/21/1921  DATE OF ADMISSION:  07/05/2014 DATE OF DISCHARGE:  07/10/2014   FINAL DIAGNOSES:  1. Fall with right hip fracture.  2. Chronic kidney disease stage IV.  3. Hyperkalemia.  4. Chronic respiratory failure.  5. Chronic right-sided systolic congestive heart failure.  6. Sleep apnea, untreated secondary to the patient's refusal to use CPAP.  7. Dementia.  8. History of acoustic neuroma with resection, with chronic right facial droop.  9. Chronic left-sided diastolic congestive heart failure.  10. Adult-onset diabetes mellitus, uncontrolled.  11. Gout.  12. Hyperlipidemia.  13. Hypothyroidism.   PRINCIPAL PROCEDURES: Status post right hip hemiarthroplasty.   HISTORY AND PHYSICAL: Please see dictated admission history and physical.    HOSPITAL COURSE: The patient was admitted after fall and hip fracture. She required correction of her hyperkalemia before going to the operating room. She went to the OR on 07/07/2014. She tolerated the procedure reasonably well. She was weaned down to her home rate of oxygen. Her mental status waxed and waned with some metabolic encephalopathy secondary to dementia and delirium related to the multiple above medical problems. She was found to have a mild urinary tract infection, and this was treated. She was changed over to Keflex to finish up treatment for this.   Multiple discussions were held with family members, and they recognize that due to her advanced age and overall poor health that she would not do well. They did not wish her to go through full rehabilitation, and instead would like her to be returned [facility], with physical therapy to be attempted, but otherwise transitioned to hospice. They recognize that she will not do well. Labs showed that she was not keeping up with her food and liquid intake very well, and they understand this as well.  At this point, she will be discharged to nursing facility in fair condition, with her overall expected course to be poor. Will attempt to weigh her daily, with a physician call if more than 2-pound gain in 1 day or 5 pounds in 1 week or increasing signs and symptoms of heart failure; however, if she continues to decline, certainly this can be discontinued. Her diet will be no added salt, no concentrated sweets. She will be on oxygen 2 liters nasal continuously. Will initiate fingerstick blood sugars with each meal and at bedtime, with sliding scale coverage; however, again, this could certainly be stopped if she appears to be close to death. The dressing can be changed as necessary, and the patient may shower and bathe. Physical therapy will evaluate and treat the patient as tolerated, and she will follow up with Dr. Martha ClanKrasinski in 1 to 2 weeks.   DISCHARGE MEDICATIONS:  1. Levothyroxine 0.05 mg p.o. daily. 2. Losartan 100 mg p.o. daily.  3. Pantoprazole 40 mg p.o. b.i.d.  4. Allopurinol 100 mg p.o. daily.  5. Imdur 30 mg p.o. daily.  6. Nitroglycerin 0.4 mg sublingually q.5 minutes x3 p.r.n. chest pain.  7. DuoNeb SVN q.6 hours as needed for wheezing.  8. Norco 5/325 one p.o. every 4 hours as needed for pain.  9. Sliding scale insulin.  10. Aspirin 81 mg p.o. daily. 11. Lopressor 25 mg p.o. b.i.d.  12. Keflex 500 mg p.o. t.i.d. for 5 days to complete course of antibiotics for urinary tract infection.  13. Iron 160 mg p.o. daily.  14. Docusate 240 mg p.o. daily.  15.  Insulin 70/30 at 16 units subcutaneous b.i.d.   CODE STATUS: The patient is do not resuscitate. An out-of-facility DNR order was sent with her to the facility.   TIME OF DISCHARGE: 40 minutes.   ____________________________ Lynnea Ferrier, MD [id number] D: 07/10/2014 08:20:26 ET T: 07/10/2014 08:37:01 ET JOB#: [id number]  cc: Curtis Sites III, MD, <Dictator> Daniel Nones MD ELECTRONICALLY SIGNED 07/11/2014 8:22

## 2015-04-12 NOTE — Op Note (Signed)
PATIENT NAME:  Patricia Cameron, [name]:  [id number] DATE OF BIRTH:  04-May-1921  DATE OF PROCEDURE:  07/07/2014  PREOPERATIVE DIAGNOSIS: Right femoral neck hip fracture.   POSTOPERATIVE DIAGNOSIS: Right femoral neck hip fracture.  PROCEDURE:  Right hip hemiarthroplasty.   ANESTHESIA: Spinal.   SURGEON: Juanell FairlyKevin Patti Shorb, M.D.   ESTIMATED BLOOD LOSS: 300 mL.   COMPLICATIONS: None.   SPECIMENS: Femoral head to pathology.   IMPLANTS: Stryker Accolade TMZF size 2.5 stem with a 42 mm Unitrax head and a +8  neck adjustment sleeve.   INDICATIONS FOR THE PROCEDURE: Patricia Cameron is a 81 year old female who sustained a fall prior to admission. She was diagnosed with a displaced femoral neck hip fracture upon presentation to the [facility] ER. The patient was admitted to the medical service and was cleared medically. I had discussed the fracture and recommended a hemiarthroplasty to the patient and her daughter. I discussed the risks and benefits of surgery with them including infection, bleeding, requiring blood transfusion, nerve or blood vessel injury, especially injury to the sciatic nerve leading to footdrop and dorsal foot numbness, fracture, dislocation, leg length discrepancy, change in lower extremity rotation, persistent right hip pain, hardware failure and the need for further surgery. Medical risks include, but are not limited to, DVT and pulmonary embolism, myocardial infarction, stroke,  pneumonia, respiratory failure and death. The patient and her daughter understood these risks and agreed with the plan for surgery.   PROCEDURE NOTE: The patient was brought to the operating room.  She underwent a spinal anesthetic by the anesthesia service. She was then positioned in a left lateral decubitus position. A pegboard was used for positioning. All bony prominences were adequately padded. She had an axillary roll placed under her left side and there was adequate padding  underneath the left leg to avoid compression of the common peroneal nerve during the case. The patient was prepped and draped in sterile fashion. A timeout was performed to verify the patient's name, date of birth, medical record number, correct site of surgery and correct procedure to be performed. It was also used to confirm the patient had received antibiotics and that all appropriate instruments, implants, and radiographic studies were available in the room. Once all in attendance were in agreement, the case began.   A curved posterolateral incision was made. The subcutaneous tissue was dissected with electrocautery. All bleeding vessels were cauterized during exposure. The fascia lata was then identified and cleared of overlying adipose tissue. The fascia lata was then incised with a deep #10 blade. The gluteus maximus muscle was split in line with its fibers revealing the underlying trochanteric bursa. This was carefully dissected off the greater trochanter along with the external rotators, which were reflected posteriorly to protect the sciatic nerve. A tagging #2 Tycron was placed into the piriformis muscle for later repair. A T-shaped capsulotomy was then performed and both leaflets of the capsule were tagged with a #3 Tycron  again for later repair. The fracture site was then easily identified. The femoral head was removed using a corkscrew device and measured to be 42 mm in diameter. The proximal femoral osteotomy was made approximately 1 fingerbreadth above the lesser trochanter. Two Cobra retractors were then placed around the acetabulum. A 42 mm trial head was then inserted in the acetabulum and had an excellent fit. The attention was then turned to the femoral preparation.   A box osteotome was used to create the initial entry into the proximal femur. A  single hand reamer was then passed down the femoral shaft. A femoral sounder was then used to ensure no penetration of the cortex that occurred  during hand reaming. Sequential broaches were then inserted into the proximal femur and gently malleted into position taking care to watch for any fractures of the calcar. The patient had the best medial lateral fit with a size 2.5 broach. The trial neck and head combinations were then trialed. The patient had the most stability and equivalent leg lengths with a +8 trial head.  All trial implants were then removed. The joint was then copiously irrigated with a pulse lavage.   The actual Stryker Accolade TMZF 2.5 mm stem was then inserted. Again, the 42 mm outer head with +8 inner head was then placed on the trunnion and reduced. Again, the leg lengths were equivalent and the patient had excellent stability. The actual Stryker Unitrax 42 mm head with a +8 neck adjustment sleeve was then malleted onto the trunnion and the implant was reduced. The capsule was then closed with #2 Tycron and both the capsule and piriformis were repaired using a soft tissue repair into the abductor tendon. The joint was then copiously irrigated again. The fascia lata was closed with an interrupted 0 Vicryl and the subcutaneous tissue was closed with 2-0 Vicryl and the skin approximated with staples. A dry sterile dressing was applied. The patient was then positioned supine on the table to measure leg lengths. They were clinically equivalent. An abduction pillow was placed between her legs and the patient was transferred to a hospital bed in stable condition. She was brought to the PACU.  I spoke with the patient's family postoperatively to let them know the case had gone without complication. The patient was stable in the recovery room.    ____________________________ Kathreen Devoid, MD klk:[id number] D: 07/10/2014 22:31:33 ET T: 07/10/2014 23:01:50 ET JOB#: [id number]  cc: Kathreen Devoid, MD, <Dictator> Kathreen Devoid MD ELECTRONICALLY SIGNED 07/24/2014 11:01

## 2015-04-12 NOTE — Consult Note (Signed)
Brief Consult Note: Diagnosis: Right femoral neck hip fracture.   Patient was seen by consultant.   Recommend to proceed with surgery or procedure.   Orders entered.   Comments: Patient is a pleasant 81 year old female with a history of congestive heart failure, renal failure, diabetes, and MI in 2014, [facility].  She was brought [facility] ER where she was diagnosed with a right femoral neck hip fracture.   Patient has had a previous femoral neck hip fracture on the right side she believes 20-30 years ago.  I have reviewed the PMHx, PSHx, medications and allergies from her EMR.  On exam today she has intact sensation to light touch throughout the right lower extremity.  She is in Buck's traction but has shortening and external rotation of the right LE.  She has intact motor function and palpable pedal pulses.  Xrays of the pelvis and right hip demonstrate a displaced femoral neck hip fracture.  I have explained the nature of the injury to the patient.  I have reviewed the risks and benefits of performing a hemiarthroplasty which is how I recommend we treat this fracture.  I answered all questions by the patient.  I have called her daughter, Thayer OhmChris, and also expalined the injury and proposed surgery.  The risks and benefits of surgical intervention were discussed in detail with the patient. The patient and her daughter expressed understanding of the risks and benefits and agreed with plans for surgery. The risks include, but are not limited to: infection, bleeding requiring transfusion, nerve and blood vessel injury (especially the sciatic nerve leading to foot drop), dislocation, fracture, leg length discrepancy, change in lwoer extremity rotation, the need for more surgery including conversion to a total hip arthroplasty, DVT, and PE, MI, stroke, pneumonia, respiratory failure and death.  I have reviewed the labs and xrays in preparation for surgery.  The case is scheduled  for tomorrow AM.  Electronic Signatures: Juanell FairlyKrasinski, Morissa Obeirne (MD)  (Signed 17-Jul-15 19:50)  Authored: Brief Consult Note   Last Updated: 17-Jul-15 19:50 by Juanell FairlyKrasinski, Tiffinie Caillier (MD)

## 2015-05-15 ENCOUNTER — Encounter
Admission: RE | Admit: 2015-05-15 | Discharge: 2015-05-15 | Disposition: A | Discharge status: Home / Self Care | Payer: Medicare Other | Source: Ambulatory Visit | Attending: Internal Medicine | Admitting: Internal Medicine

## 2015-05-21 ENCOUNTER — Encounter
Admission: RE | Admit: 2015-05-21 | Discharge: 2015-05-21 | Disposition: A | Discharge status: Home / Self Care | Payer: Medicare Other | Source: Ambulatory Visit | Attending: Internal Medicine | Admitting: Internal Medicine

## 2015-06-20 ENCOUNTER — Encounter
Admission: RE | Admit: 2015-06-20 | Discharge: 2015-06-20 | Disposition: A | Discharge status: Home / Self Care | Payer: Medicare Other | Source: Ambulatory Visit | Attending: Internal Medicine | Admitting: Internal Medicine

## 2015-07-21 ENCOUNTER — Encounter
Admission: RE | Admit: 2015-07-21 | Discharge: 2015-07-21 | Disposition: A | Discharge status: Home / Self Care | Payer: Medicare Other | Source: Ambulatory Visit | Attending: Internal Medicine | Admitting: Internal Medicine

## 2015-08-07 LAB — GLUCOSE, CAPILLARY: GLUCOSE-CAPILLARY: 107 mg/dL — AB (ref 65–99)

## 2015-08-08 LAB — GLUCOSE, CAPILLARY: GLUCOSE-CAPILLARY: 137 mg/dL — AB (ref 65–99)

## 2015-08-12 LAB — GLUCOSE, CAPILLARY: Glucose-Capillary: 120 mg/dL — ABNORMAL HIGH (ref 65–99)

## 2015-08-13 LAB — GLUCOSE, CAPILLARY: Glucose-Capillary: 147 mg/dL — ABNORMAL HIGH (ref 65–99)

## 2015-08-16 LAB — GLUCOSE, CAPILLARY: Glucose-Capillary: 81 mg/dL (ref 65–99)

## 2015-08-17 LAB — GLUCOSE, CAPILLARY: GLUCOSE-CAPILLARY: 78 mg/dL (ref 65–99)

## 2015-08-18 LAB — GLUCOSE, CAPILLARY: Glucose-Capillary: 149 mg/dL — ABNORMAL HIGH (ref 65–99)

## 2015-08-20 LAB — GLUCOSE, CAPILLARY: GLUCOSE-CAPILLARY: 120 mg/dL — AB (ref 65–99)

## 2015-08-21 ENCOUNTER — Encounter
Admission: RE | Admit: 2015-08-21 | Discharge: 2015-08-21 | Disposition: A | Discharge status: Home / Self Care | Payer: Medicare Other | Source: Ambulatory Visit | Attending: Internal Medicine | Admitting: Internal Medicine

## 2015-08-21 DIAGNOSIS — R410 Disorientation, unspecified: Secondary | ICD-10-CM | POA: Insufficient documentation

## 2015-08-22 LAB — GLUCOSE, CAPILLARY: GLUCOSE-CAPILLARY: 162 mg/dL — AB (ref 65–99)

## 2015-08-26 LAB — GLUCOSE, CAPILLARY: Glucose-Capillary: 157 mg/dL — ABNORMAL HIGH (ref 65–99)

## 2015-08-27 LAB — GLUCOSE, CAPILLARY: GLUCOSE-CAPILLARY: 144 mg/dL — AB (ref 65–99)

## 2015-08-30 LAB — GLUCOSE, CAPILLARY: GLUCOSE-CAPILLARY: 110 mg/dL — AB (ref 65–99)

## 2015-08-31 LAB — GLUCOSE, CAPILLARY: Glucose-Capillary: 112 mg/dL — ABNORMAL HIGH (ref 65–99)

## 2015-09-01 LAB — GLUCOSE, CAPILLARY: GLUCOSE-CAPILLARY: 119 mg/dL — AB (ref 65–99)

## 2015-09-04 LAB — GLUCOSE, CAPILLARY: Glucose-Capillary: 117 mg/dL — ABNORMAL HIGH (ref 65–99)

## 2015-09-05 LAB — GLUCOSE, CAPILLARY: Glucose-Capillary: 131 mg/dL — ABNORMAL HIGH (ref 65–99)

## 2015-09-09 LAB — GLUCOSE, CAPILLARY: Glucose-Capillary: 235 mg/dL — ABNORMAL HIGH (ref 65–99)

## 2015-09-10 LAB — GLUCOSE, CAPILLARY: Glucose-Capillary: 115 mg/dL — ABNORMAL HIGH (ref 65–99)

## 2015-09-11 DIAGNOSIS — R410 Disorientation, unspecified: Secondary | ICD-10-CM | POA: Diagnosis present

## 2015-09-11 LAB — URINALYSIS COMPLETE WITH MICROSCOPIC (ARMC ONLY)
BILIRUBIN URINE: NEGATIVE
GLUCOSE, UA: NEGATIVE mg/dL
KETONES UR: NEGATIVE mg/dL
NITRITE: POSITIVE — AB
Protein, ur: NEGATIVE mg/dL
SPECIFIC GRAVITY, URINE: 1.012 (ref 1.005–1.030)
pH: 5 (ref 5.0–8.0)

## 2015-09-13 LAB — URINE CULTURE: Culture: >=100000

## 2015-09-14 LAB — GLUCOSE, CAPILLARY: Glucose-Capillary: 146 mg/dL — ABNORMAL HIGH (ref 65–99)

## 2015-09-15 LAB — GLUCOSE, CAPILLARY
Glucose-Capillary: 116 mg/dL — ABNORMAL HIGH (ref 65–99)
Glucose-Capillary: 47 mg/dL — ABNORMAL LOW (ref 65–99)

## 2015-09-18 LAB — GLUCOSE, CAPILLARY: Glucose-Capillary: 154 mg/dL — ABNORMAL HIGH (ref 65–99)

## 2015-09-19 DIAGNOSIS — R410 Disorientation, unspecified: Secondary | ICD-10-CM | POA: Diagnosis not present

## 2015-09-19 LAB — BASIC METABOLIC PANEL
ANION GAP: 7 (ref 5–15)
BUN: 31 mg/dL — ABNORMAL HIGH (ref 6–20)
CHLORIDE: 102 mmol/L (ref 101–111)
CO2: 34 mmol/L — AB (ref 22–32)
Calcium: 8.7 mg/dL — ABNORMAL LOW (ref 8.9–10.3)
Creatinine, Ser: 2.05 mg/dL — ABNORMAL HIGH (ref 0.44–1.00)
GFR calc non Af Amer: 20 mL/min — ABNORMAL LOW (ref >60)
GFR, EST AFRICAN AMERICAN: 23 mL/min — AB (ref >60)
Glucose, Bld: 109 mg/dL — ABNORMAL HIGH (ref 65–99)
POTASSIUM: 4.1 mmol/L (ref 3.5–5.1)
SODIUM: 143 mmol/L (ref 135–145)

## 2015-09-19 LAB — MAGNESIUM: MAGNESIUM: 2.2 mg/dL (ref 1.7–2.4)

## 2015-09-19 LAB — CBC
HEMATOCRIT: 31.8 % — AB (ref 35.0–47.0)
HEMOGLOBIN: 10.1 g/dL — AB (ref 12.0–16.0)
MCH: 29.8 pg (ref 26.0–34.0)
MCHC: 31.7 g/dL — ABNORMAL LOW (ref 32.0–36.0)
MCV: 94 fL (ref 80.0–100.0)
Platelets: 256 10*3/uL (ref 150–440)
RBC: 3.39 MIL/uL — AB (ref 3.80–5.20)
RDW: 16.4 % — ABNORMAL HIGH (ref 11.5–14.5)
WBC: 8.5 10*3/uL (ref 3.6–11.0)

## 2015-09-19 LAB — VITAMIN B12: VITAMIN B 12: 907 pg/mL (ref 180–914)

## 2015-09-19 LAB — TSH: TSH: 5.584 u[IU]/mL — AB (ref 0.350–4.500)

## 2015-09-19 LAB — GLUCOSE, CAPILLARY: GLUCOSE-CAPILLARY: 124 mg/dL — AB (ref 65–99)

## 2015-09-20 ENCOUNTER — Encounter
Admission: RE | Admit: 2015-09-20 | Discharge: 2015-09-20 | Disposition: A | Discharge status: Home / Self Care | Payer: Medicare Other | Source: Ambulatory Visit | Attending: Internal Medicine | Admitting: Internal Medicine

## 2015-09-23 LAB — GLUCOSE, CAPILLARY: GLUCOSE-CAPILLARY: 93 mg/dL (ref 65–99)

## 2015-09-24 LAB — GLUCOSE, CAPILLARY: Glucose-Capillary: 153 mg/dL — ABNORMAL HIGH (ref 65–99)

## 2015-09-25 LAB — VITAMIN D 1,25 DIHYDROXY
VITAMIN D 1, 25 (OH) TOTAL: 44 pg/mL
Vitamin D3 1, 25 (OH)2: 44 pg/mL

## 2015-09-26 LAB — GLUCOSE, CAPILLARY: GLUCOSE-CAPILLARY: 101 mg/dL — AB (ref 65–99)

## 2015-09-27 LAB — GLUCOSE, CAPILLARY: Glucose-Capillary: 113 mg/dL — ABNORMAL HIGH (ref 65–99)

## 2015-09-28 LAB — GLUCOSE, CAPILLARY: GLUCOSE-CAPILLARY: 104 mg/dL — AB (ref 65–99)

## 2015-09-29 LAB — GLUCOSE, CAPILLARY: Glucose-Capillary: 98 mg/dL (ref 65–99)

## 2015-09-30 ENCOUNTER — Emergency Department: Payer: Medicare Other

## 2015-09-30 ENCOUNTER — Encounter: Payer: Self-pay | Admitting: Emergency Medicine

## 2015-09-30 ENCOUNTER — Inpatient Hospital Stay
Admission: EM | Admit: 2015-09-30 | Discharge: 2015-10-03 | DRG: 536 | Disposition: A | Discharge status: Skilled Nursing Facility | Payer: Medicare Other | Attending: Internal Medicine | Admitting: Internal Medicine

## 2015-09-30 DIAGNOSIS — E039 Hypothyroidism, unspecified: Secondary | ICD-10-CM | POA: Diagnosis present

## 2015-09-30 DIAGNOSIS — E785 Hyperlipidemia, unspecified: Secondary | ICD-10-CM | POA: Diagnosis present

## 2015-09-30 DIAGNOSIS — H3530 Unspecified macular degeneration: Secondary | ICD-10-CM | POA: Diagnosis present

## 2015-09-30 DIAGNOSIS — R269 Unspecified abnormalities of gait and mobility: Secondary | ICD-10-CM | POA: Diagnosis present

## 2015-09-30 DIAGNOSIS — M109 Gout, unspecified: Secondary | ICD-10-CM | POA: Diagnosis present

## 2015-09-30 DIAGNOSIS — K219 Gastro-esophageal reflux disease without esophagitis: Secondary | ICD-10-CM | POA: Diagnosis present

## 2015-09-30 DIAGNOSIS — S7222XA Displaced subtrochanteric fracture of left femur, initial encounter for closed fracture: Secondary | ICD-10-CM | POA: Insufficient documentation

## 2015-09-30 DIAGNOSIS — Z87891 Personal history of nicotine dependence: Secondary | ICD-10-CM

## 2015-09-30 DIAGNOSIS — R0989 Other specified symptoms and signs involving the circulatory and respiratory systems: Secondary | ICD-10-CM | POA: Diagnosis present

## 2015-09-30 DIAGNOSIS — F0390 Unspecified dementia without behavioral disturbance: Secondary | ICD-10-CM | POA: Diagnosis present

## 2015-09-30 DIAGNOSIS — Z96641 Presence of right artificial hip joint: Secondary | ICD-10-CM | POA: Diagnosis not present

## 2015-09-30 DIAGNOSIS — I2510 Atherosclerotic heart disease of native coronary artery without angina pectoris: Secondary | ICD-10-CM | POA: Diagnosis present

## 2015-09-30 DIAGNOSIS — H9190 Unspecified hearing loss, unspecified ear: Secondary | ICD-10-CM | POA: Diagnosis present

## 2015-09-30 DIAGNOSIS — F329 Major depressive disorder, single episode, unspecified: Secondary | ICD-10-CM | POA: Diagnosis present

## 2015-09-30 DIAGNOSIS — Z96643 Presence of artificial hip joint, bilateral: Secondary | ICD-10-CM | POA: Diagnosis present

## 2015-09-30 DIAGNOSIS — R296 Repeated falls: Secondary | ICD-10-CM | POA: Diagnosis present

## 2015-09-30 DIAGNOSIS — F419 Anxiety disorder, unspecified: Secondary | ICD-10-CM | POA: Diagnosis present

## 2015-09-30 DIAGNOSIS — I517 Cardiomegaly: Secondary | ICD-10-CM | POA: Diagnosis present

## 2015-09-30 DIAGNOSIS — M1990 Unspecified osteoarthritis, unspecified site: Secondary | ICD-10-CM | POA: Diagnosis present

## 2015-09-30 DIAGNOSIS — Z515 Encounter for palliative care: Secondary | ICD-10-CM | POA: Diagnosis not present

## 2015-09-30 DIAGNOSIS — F22 Delusional disorders: Secondary | ICD-10-CM | POA: Diagnosis present

## 2015-09-30 DIAGNOSIS — Z79891 Long term (current) use of opiate analgesic: Secondary | ICD-10-CM | POA: Diagnosis not present

## 2015-09-30 DIAGNOSIS — S7292XA Unspecified fracture of left femur, initial encounter for closed fracture: Secondary | ICD-10-CM | POA: Diagnosis present

## 2015-09-30 DIAGNOSIS — Z794 Long term (current) use of insulin: Secondary | ICD-10-CM

## 2015-09-30 DIAGNOSIS — K649 Unspecified hemorrhoids: Secondary | ICD-10-CM | POA: Diagnosis present

## 2015-09-30 DIAGNOSIS — R32 Unspecified urinary incontinence: Secondary | ICD-10-CM | POA: Diagnosis present

## 2015-09-30 DIAGNOSIS — E1122 Type 2 diabetes mellitus with diabetic chronic kidney disease: Secondary | ICD-10-CM | POA: Diagnosis present

## 2015-09-30 DIAGNOSIS — I252 Old myocardial infarction: Secondary | ICD-10-CM

## 2015-09-30 DIAGNOSIS — J302 Other seasonal allergic rhinitis: Secondary | ICD-10-CM | POA: Diagnosis present

## 2015-09-30 DIAGNOSIS — R7989 Other specified abnormal findings of blood chemistry: Secondary | ICD-10-CM | POA: Diagnosis not present

## 2015-09-30 DIAGNOSIS — R1310 Dysphagia, unspecified: Secondary | ICD-10-CM | POA: Diagnosis present

## 2015-09-30 DIAGNOSIS — J449 Chronic obstructive pulmonary disease, unspecified: Secondary | ICD-10-CM | POA: Diagnosis present

## 2015-09-30 DIAGNOSIS — I5022 Chronic systolic (congestive) heart failure: Secondary | ICD-10-CM | POA: Diagnosis present

## 2015-09-30 DIAGNOSIS — I130 Hypertensive heart and chronic kidney disease with heart failure and stage 1 through stage 4 chronic kidney disease, or unspecified chronic kidney disease: Secondary | ICD-10-CM | POA: Diagnosis present

## 2015-09-30 DIAGNOSIS — G4733 Obstructive sleep apnea (adult) (pediatric): Secondary | ICD-10-CM | POA: Diagnosis present

## 2015-09-30 DIAGNOSIS — Z79899 Other long term (current) drug therapy: Secondary | ICD-10-CM | POA: Diagnosis not present

## 2015-09-30 DIAGNOSIS — Z7982 Long term (current) use of aspirin: Secondary | ICD-10-CM | POA: Diagnosis not present

## 2015-09-30 DIAGNOSIS — N189 Chronic kidney disease, unspecified: Secondary | ICD-10-CM | POA: Diagnosis present

## 2015-09-30 DIAGNOSIS — Y92129 Unspecified place in nursing home as the place of occurrence of the external cause: Secondary | ICD-10-CM

## 2015-09-30 DIAGNOSIS — I248 Other forms of acute ischemic heart disease: Secondary | ICD-10-CM | POA: Diagnosis present

## 2015-09-30 DIAGNOSIS — K5900 Constipation, unspecified: Secondary | ICD-10-CM | POA: Diagnosis present

## 2015-09-30 DIAGNOSIS — R778 Other specified abnormalities of plasma proteins: Secondary | ICD-10-CM | POA: Diagnosis present

## 2015-09-30 DIAGNOSIS — Z888 Allergy status to other drugs, medicaments and biological substances status: Secondary | ICD-10-CM

## 2015-09-30 DIAGNOSIS — Z66 Do not resuscitate: Secondary | ICD-10-CM | POA: Diagnosis present

## 2015-09-30 DIAGNOSIS — W1830XA Fall on same level, unspecified, initial encounter: Secondary | ICD-10-CM | POA: Diagnosis present

## 2015-09-30 DIAGNOSIS — I509 Heart failure, unspecified: Secondary | ICD-10-CM

## 2015-09-30 HISTORY — DX: Obstructive sleep apnea (adult) (pediatric): G47.33

## 2015-09-30 HISTORY — DX: Depression, unspecified: F32.A

## 2015-09-30 HISTORY — DX: Unspecified dementia, unspecified severity, without behavioral disturbance, psychotic disturbance, mood disturbance, and anxiety: F03.90

## 2015-09-30 HISTORY — DX: Chronic obstructive pulmonary disease, unspecified: J44.9

## 2015-09-30 HISTORY — DX: Heart failure, unspecified: I50.9

## 2015-09-30 HISTORY — DX: Disorder of thyroid, unspecified: E07.9

## 2015-09-30 HISTORY — DX: Bell's palsy: G51.0

## 2015-09-30 HISTORY — DX: Chronic kidney disease, unspecified: N18.9

## 2015-09-30 HISTORY — DX: Unspecified macular degeneration: H35.30

## 2015-09-30 HISTORY — DX: Gastro-esophageal reflux disease without esophagitis: K21.9

## 2015-09-30 HISTORY — DX: Type 2 diabetes mellitus without complications: E11.9

## 2015-09-30 HISTORY — DX: Hyperlipidemia, unspecified: E78.5

## 2015-09-30 HISTORY — DX: Old myocardial infarction: I25.2

## 2015-09-30 HISTORY — DX: Gout, unspecified: M10.9

## 2015-09-30 HISTORY — DX: Major depressive disorder, single episode, unspecified: F32.9

## 2015-09-30 LAB — CBC WITH DIFFERENTIAL/PLATELET
Basophils Absolute: 0 10*3/uL (ref 0–0.1)
Basophils Relative: 1 %
EOS ABS: 0.3 10*3/uL (ref 0–0.7)
Eosinophils Relative: 3 %
HCT: 32.8 % — ABNORMAL LOW (ref 35.0–47.0)
HEMOGLOBIN: 10.8 g/dL — AB (ref 12.0–16.0)
LYMPHS ABS: 1.3 10*3/uL (ref 1.0–3.6)
Lymphocytes Relative: 15 %
MCH: 30.9 pg (ref 26.0–34.0)
MCHC: 32.8 g/dL (ref 32.0–36.0)
MCV: 94.3 fL (ref 80.0–100.0)
MONOS PCT: 7 %
Monocytes Absolute: 0.6 10*3/uL (ref 0.2–0.9)
NEUTROS PCT: 74 %
Neutro Abs: 6 10*3/uL (ref 1.4–6.5)
Platelets: 229 10*3/uL (ref 150–440)
RBC: 3.48 MIL/uL — ABNORMAL LOW (ref 3.80–5.20)
RDW: 15.7 % — ABNORMAL HIGH (ref 11.5–14.5)
WBC: 8.2 10*3/uL (ref 3.6–11.0)

## 2015-09-30 LAB — GLUCOSE, CAPILLARY: Glucose-Capillary: 215 mg/dL — ABNORMAL HIGH (ref 65–99)

## 2015-09-30 LAB — COMPREHENSIVE METABOLIC PANEL
ALK PHOS: 64 U/L (ref 38–126)
ALT: 13 U/L — ABNORMAL LOW (ref 14–54)
ANION GAP: 7 (ref 5–15)
AST: 20 U/L (ref 15–41)
Albumin: 3.7 g/dL (ref 3.5–5.0)
BILIRUBIN TOTAL: 0.4 mg/dL (ref 0.3–1.2)
BUN: 29 mg/dL — ABNORMAL HIGH (ref 6–20)
CALCIUM: 9.2 mg/dL (ref 8.9–10.3)
CO2: 34 mmol/L — AB (ref 22–32)
Chloride: 101 mmol/L (ref 101–111)
Creatinine, Ser: 1.8 mg/dL — ABNORMAL HIGH (ref 0.44–1.00)
GFR calc non Af Amer: 23 mL/min — ABNORMAL LOW (ref >60)
GFR, EST AFRICAN AMERICAN: 27 mL/min — AB (ref >60)
Glucose, Bld: 216 mg/dL — ABNORMAL HIGH (ref 65–99)
Potassium: 4.2 mmol/L (ref 3.5–5.1)
SODIUM: 142 mmol/L (ref 135–145)
TOTAL PROTEIN: 6.8 g/dL (ref 6.5–8.1)

## 2015-09-30 LAB — TROPONIN I
TROPONIN I: 0.1 ng/mL — AB (ref <0.031)
Troponin I: 0.08 ng/mL — ABNORMAL HIGH (ref <0.031)

## 2015-09-30 MED ORDER — ASPIRIN 81 MG PO CHEW
324.0000 mg | CHEWABLE_TABLET | Freq: Once | ORAL | Status: AC
  Administered 2015-09-30: 324 mg via ORAL
  Filled 2015-09-30: qty 4

## 2015-09-30 MED ORDER — GUAIFENESIN 100 MG/5ML PO SOLN: 10.0000 mL | ORAL | Status: DC | PRN

## 2015-09-30 MED ORDER — VITAMIN D 1000 UNITS PO TABS
2000.0000 [IU] | ORAL_TABLET | Freq: Every day | ORAL | Status: DC
  Administered 2015-10-01 – 2015-10-03 (×3): 2000 [IU] via ORAL
  Filled 2015-09-30 (×3): qty 2

## 2015-09-30 MED ORDER — ISOSORBIDE MONONITRATE ER 30 MG PO TB24
30.0000 mg | ORAL_TABLET | Freq: Every day | ORAL | Status: DC
  Administered 2015-10-01 – 2015-10-02 (×2): 30 mg via ORAL
  Filled 2015-09-30 (×2): qty 1

## 2015-09-30 MED ORDER — GUAIFENESIN ER 600 MG PO TB12
600.0000 mg | ORAL_TABLET | Freq: Two times a day (BID) | ORAL | Status: DC
  Administered 2015-09-30 – 2015-10-03 (×6): 600 mg via ORAL
  Filled 2015-09-30 (×6): qty 1

## 2015-09-30 MED ORDER — VENLAFAXINE HCL ER 75 MG PO CP24
75.0000 mg | ORAL_CAPSULE | Freq: Every day | ORAL | Status: DC
  Administered 2015-10-01 – 2015-10-03 (×3): 75 mg via ORAL
  Filled 2015-09-30 (×3): qty 1

## 2015-09-30 MED ORDER — TRAMADOL HCL 50 MG PO TABS: 50.0000 mg | ORAL_TABLET | Freq: Four times a day (QID) | ORAL | Status: DC | PRN

## 2015-09-30 MED ORDER — ACETAMINOPHEN 650 MG RE SUPP: 650.0000 mg | Freq: Four times a day (QID) | RECTAL | Status: DC | PRN

## 2015-09-30 MED ORDER — ALLOPURINOL 100 MG PO TABS
100.0000 mg | ORAL_TABLET | Freq: Every day | ORAL | Status: DC
  Administered 2015-10-01 – 2015-10-03 (×3): 100 mg via ORAL
  Filled 2015-09-30 (×3): qty 1

## 2015-09-30 MED ORDER — MORPHINE SULFATE (PF) 2 MG/ML IV SOLN
INTRAVENOUS | Status: AC
  Administered 2015-09-30: 2 mg via INTRAVENOUS
  Filled 2015-09-30: qty 1

## 2015-09-30 MED ORDER — LORATADINE 10 MG PO TABS
10.0000 mg | ORAL_TABLET | Freq: Every day | ORAL | Status: DC
Start: 2015-09-30 — End: 2015-10-03
  Administered 2015-10-01 – 2015-10-03 (×3): 10 mg via ORAL
  Filled 2015-09-30 (×3): qty 1

## 2015-09-30 MED ORDER — INSULIN ASPART 100 UNIT/ML ~~LOC~~ SOLN
0.0000 [IU] | Freq: Three times a day (TID) | SUBCUTANEOUS | Status: DC
  Administered 2015-10-01: 1 [IU] via SUBCUTANEOUS
  Administered 2015-10-01: 2 [IU] via SUBCUTANEOUS
  Administered 2015-10-01 – 2015-10-02 (×3): 1 [IU] via SUBCUTANEOUS
  Administered 2015-10-03: 2 [IU] via SUBCUTANEOUS
  Administered 2015-10-03: 1 [IU] via SUBCUTANEOUS
  Filled 2015-09-30 (×2): qty 1
  Filled 2015-09-30: qty 2
  Filled 2015-09-30 (×2): qty 1

## 2015-09-30 MED ORDER — ONDANSETRON HCL 4 MG/2ML IJ SOLN: 4.0000 mg | Freq: Four times a day (QID) | INTRAMUSCULAR | Status: DC | PRN

## 2015-09-30 MED ORDER — ROPINIROLE HCL 0.25 MG PO TABS
0.2500 mg | ORAL_TABLET | Freq: Every day | ORAL | Status: DC
  Administered 2015-09-30 – 2015-10-02 (×3): 0.25 mg via ORAL
  Filled 2015-09-30 (×3): qty 1

## 2015-09-30 MED ORDER — PANTOPRAZOLE SODIUM 40 MG PO TBEC
40.0000 mg | DELAYED_RELEASE_TABLET | Freq: Two times a day (BID) | ORAL | Status: DC
  Administered 2015-09-30 – 2015-10-03 (×6): 40 mg via ORAL
  Filled 2015-09-30 (×6): qty 1

## 2015-09-30 MED ORDER — CYANOCOBALAMIN 500 MCG PO TABS
250.0000 ug | ORAL_TABLET | Freq: Every day | ORAL | Status: DC
  Administered 2015-10-01: 250 ug via ORAL
  Administered 2015-10-02: 500 ug via ORAL
  Administered 2015-10-03: 250 ug via ORAL
  Filled 2015-09-30 (×3): qty 1

## 2015-09-30 MED ORDER — LEVOTHYROXINE SODIUM 75 MCG PO TABS
75.0000 ug | ORAL_TABLET | Freq: Every day | ORAL | Status: DC
Start: 2015-10-01 — End: 2015-10-03
  Administered 2015-10-01 – 2015-10-03 (×3): 75 ug via ORAL
  Filled 2015-09-30 (×3): qty 1

## 2015-09-30 MED ORDER — OXYCODONE HCL 5 MG PO TABS
5.0000 mg | ORAL_TABLET | ORAL | Status: DC | PRN
  Administered 2015-10-02 – 2015-10-03 (×7): 5 mg via ORAL
  Filled 2015-09-30 (×7): qty 1

## 2015-09-30 MED ORDER — ONDANSETRON HCL 4 MG PO TABS: 4.0000 mg | ORAL_TABLET | Freq: Four times a day (QID) | ORAL | Status: DC | PRN

## 2015-09-30 MED ORDER — LAMOTRIGINE 100 MG PO TABS
50.0000 mg | ORAL_TABLET | Freq: Two times a day (BID) | ORAL | Status: DC
  Administered 2015-09-30 – 2015-10-03 (×6): 50 mg via ORAL
  Filled 2015-09-30 (×6): qty 1

## 2015-09-30 MED ORDER — ALUM & MAG HYDROXIDE-SIMETH 200-200-20 MG/5ML PO SUSP: 30.0000 mL | ORAL | Status: DC | PRN

## 2015-09-30 MED ORDER — SODIUM CHLORIDE 0.9 % IV SOLN
INTRAVENOUS | Status: DC
  Administered 2015-09-30 – 2015-10-01 (×2): via INTRAVENOUS

## 2015-09-30 MED ORDER — INSULIN ASPART PROT & ASPART (70-30 MIX) 100 UNIT/ML ~~LOC~~ SUSP
16.0000 [IU] | Freq: Every morning | SUBCUTANEOUS | Status: DC
  Administered 2015-10-01 – 2015-10-03 (×3): 16 [IU] via SUBCUTANEOUS
  Filled 2015-09-30 (×3): qty 16

## 2015-09-30 MED ORDER — ACETAMINOPHEN 325 MG PO TABS
650.0000 mg | ORAL_TABLET | Freq: Four times a day (QID) | ORAL | Status: DC | PRN
  Administered 2015-10-01: 650 mg via ORAL
  Filled 2015-09-30: qty 2

## 2015-09-30 MED ORDER — MORPHINE SULFATE (PF) 2 MG/ML IV SOLN
2.0000 mg | INTRAVENOUS | Status: DC | PRN
  Administered 2015-10-01 – 2015-10-02 (×4): 2 mg via INTRAVENOUS
  Filled 2015-09-30 (×4): qty 1

## 2015-09-30 MED ORDER — SODIUM CHLORIDE 0.9 % IJ SOLN
3.0000 mL | Freq: Two times a day (BID) | INTRAMUSCULAR | Status: DC
  Administered 2015-10-01 – 2015-10-03 (×4): 3 mL via INTRAVENOUS

## 2015-09-30 MED ORDER — FERROUS SULFATE 325 (65 FE) MG PO TABS
325.0000 mg | ORAL_TABLET | Freq: Every day | ORAL | Status: DC
  Administered 2015-10-01 – 2015-10-03 (×3): 325 mg via ORAL
  Filled 2015-09-30 (×3): qty 1

## 2015-09-30 MED ORDER — ASPIRIN 81 MG PO CHEW
81.0000 mg | CHEWABLE_TABLET | Freq: Every day | ORAL | Status: DC
  Administered 2015-10-01 – 2015-10-03 (×3): 81 mg via ORAL
  Filled 2015-09-30 (×3): qty 1

## 2015-09-30 MED ORDER — POLYETHYLENE GLYCOL 3350 17 G PO PACK: 17.0000 g | PACK | Freq: Every day | ORAL | Status: DC | PRN

## 2015-09-30 MED ORDER — IPRATROPIUM-ALBUTEROL 0.5-2.5 (3) MG/3ML IN SOLN
3.0000 mL | Freq: Four times a day (QID) | RESPIRATORY_TRACT | Status: DC
  Administered 2015-10-01: 3 mL via RESPIRATORY_TRACT
  Filled 2015-09-30: qty 3

## 2015-09-30 MED ORDER — HEPARIN SODIUM (PORCINE) 5000 UNIT/ML IJ SOLN
5000.0000 [IU] | Freq: Three times a day (TID) | INTRAMUSCULAR | Status: DC
  Administered 2015-09-30 – 2015-10-03 (×9): 5000 [IU] via SUBCUTANEOUS
  Filled 2015-09-30 (×9): qty 1

## 2015-09-30 MED ORDER — METHOCARBAMOL 500 MG PO TABS: 500.0000 mg | ORAL_TABLET | Freq: Four times a day (QID) | ORAL | Status: DC | PRN

## 2015-09-30 MED ORDER — HYDRALAZINE HCL 20 MG/ML IJ SOLN: 10.0000 mg | INTRAMUSCULAR | Status: DC | PRN

## 2015-09-30 MED ORDER — LOSARTAN POTASSIUM 50 MG PO TABS
100.0000 mg | ORAL_TABLET | Freq: Every day | ORAL | Status: DC
Start: 2015-09-30 — End: 2015-10-03
  Administered 2015-10-01 – 2015-10-03 (×3): 100 mg via ORAL
  Filled 2015-09-30 (×3): qty 2

## 2015-09-30 MED ORDER — IPRATROPIUM-ALBUTEROL 0.5-2.5 (3) MG/3ML IN SOLN: 3.0000 mL | Freq: Four times a day (QID) | RESPIRATORY_TRACT | Status: DC

## 2015-09-30 MED ORDER — CHOLESTYRAMINE 4 G PO PACK
2.0000 g | PACK | Freq: Two times a day (BID) | ORAL | Status: DC | PRN
  Filled 2015-09-30: qty 1

## 2015-09-30 MED ORDER — DOCUSATE SODIUM 100 MG PO CAPS
100.0000 mg | ORAL_CAPSULE | Freq: Two times a day (BID) | ORAL | Status: DC
  Administered 2015-09-30 – 2015-10-03 (×6): 100 mg via ORAL
  Filled 2015-09-30 (×5): qty 1

## 2015-09-30 MED ORDER — INSULIN ASPART 100 UNIT/ML ~~LOC~~ SOLN
0.0000 [IU] | Freq: Every day | SUBCUTANEOUS | Status: DC
  Administered 2015-09-30 – 2015-10-02 (×2): 2 [IU] via SUBCUTANEOUS
  Filled 2015-09-30: qty 2
  Filled 2015-09-30: qty 3
  Filled 2015-09-30 (×2): qty 2

## 2015-09-30 MED ORDER — TORSEMIDE 5 MG PO TABS
10.0000 mg | ORAL_TABLET | Freq: Every day | ORAL | Status: DC
  Administered 2015-10-01 – 2015-10-03 (×3): 10 mg via ORAL
  Filled 2015-09-30 (×3): qty 2

## 2015-09-30 MED ORDER — MORPHINE SULFATE (PF) 2 MG/ML IV SOLN
2.0000 mg | Freq: Once | INTRAVENOUS | Status: AC
  Administered 2015-09-30: 2 mg via INTRAVENOUS

## 2015-09-30 MED ORDER — INSULIN ASPART PROT & ASPART (70-30 MIX) 100 UNIT/ML ~~LOC~~ SUSP
12.0000 [IU] | Freq: Every evening | SUBCUTANEOUS | Status: DC
  Administered 2015-09-30 – 2015-10-01 (×2): 12 [IU] via SUBCUTANEOUS
  Filled 2015-09-30 (×3): qty 12

## 2015-09-30 MED ORDER — IPRATROPIUM-ALBUTEROL 0.5-2.5 (3) MG/3ML IN SOLN: 3.0000 mL | Freq: Four times a day (QID) | RESPIRATORY_TRACT | Status: DC | PRN

## 2015-09-30 NOTE — Discharge Instructions (Signed)
As we discussed, Patricia Cameron has a fracture of her proximal femur, but it is non-displaced and non-operative according to the orthopedic surgeon.  We discussed the case with the patient's daughter, [name], and we agreed to hold off on additional workup at this time.  An electronic message was sent to Dr. Dareen Piano to let him know about the situation.  Please Give her the prescribed pain medicine only if needed for moderate to severe pain.  She needs to remain NON-WEIGHT-BEARING on the left leg.  Please have her follow-up with Dr. Dareen Piano at the next available opportunity.  Unless he has different plans, she should also follow up with Dr. Martha Clan (orthopedic surgery) in about one week.  Return to the emergency department with new or worsening symptoms.   Hip Fracture A hip fracture is a fracture of the upper part of your thigh bone (femur).  CAUSES A hip fracture is caused by a direct blow to the side of your hip. This is usually the result of a fall but can occur in other circumstances, such as an automobile accident. RISK FACTORS There is an increased risk of hip fractures in people with:  An unsteady walking pattern (gait) and those with conditions that contribute to poor balance, such as Parkinson's disease or dementia.  Osteopenia and osteoporosis.  Cancer that spreads to the leg bones.  Certain metabolic diseases. SYMPTOMS  Symptoms of hip fracture include:  Pain over the injured hip.  Inability to put weight on the leg in which the fracture occurred (although, some patients are able to walk after a hip fracture).  Toes and foot of the affected leg point outward when you lie down. DIAGNOSIS A physical exam can determine if a hip fracture is likely to have occurred. X-ray exams are needed to confirm the fracture and to look for other injuries. The X-ray exam can help to determine the type of hip fracture. Rarely, the fracture is not visible on an X-ray image and a CT scan or  MRI will have to be done. TREATMENT  The treatment for a fracture is usually surgery. This means using a screw, nail, or rod to hold the bones in place.  HOME CARE INSTRUCTIONS Take all medicines as directed by your health care provider. SEEK MEDICAL CARE IF: Pain continues, even after taking pain medicine. MAKE SURE YOU:  Understand these instructions.   Will watch your condition.  Will get help right away if you are not doing well or get worse.   This information is not intended to replace advice given to you by your health care provider. Make sure you discuss any questions you have with your health care provider.   Document Released: 12/06/2005 Document Revised: 12/11/2013 Document Reviewed: 07/18/2013 Elsevier Interactive Patient Education [employer]

## 2015-09-30 NOTE — ED Provider Notes (Signed)
[facility] Emergency Department Provider Note  ____________________________________________  Time seen: Approximately 6:47 PM  I have reviewed the triage vital signs and the nursing notes.   HISTORY  Chief Complaint Fall  The patient has chronic dementia and is extremely hard of hearing  HPI Saysha Menta is a 81 y.o. female with extensive chronic medical issues who is not supposed to ambulate without direct assistance and who lives at a skilled nursing facility who presents by EMS after a fall.  She reportedlygot up to walk around by herself and fell and is complaining of pain in her hips, worse on the left.  She has done this in the past according to her daughter and has had multiple orthopedic injuries in the past including a repair of the right hip about a year and half ago by Dr. Martha Clan.  She currently is in no acute distress and has no complaints.  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  She is in no distress at this time.  There is no deformity of her affected extremity.   Past Medical History  Diagnosis Date  . COPD (chronic obstructive pulmonary disease) (HCC)   . Dementia   . CHF (congestive heart failure) (HCC)   . Gout   . OSA (obstructive sleep apnea)   . Diabetes mellitus without complication (HCC)   . Macular degeneration   . Bell's palsy   . CKD (chronic kidney disease)   . Thyroid disease     hypothyroidism  . GERD (gastroesophageal reflux disease)   . Hyperlipidemia   . Depression   . Myocardial infarction, old     There are no active problems to display for this patient.   History reviewed. No pertinent past surgical history.  Current Outpatient Rx  Name  Route  Sig  Dispense  Refill  . acetaminophen (TYLENOL) 325 MG tablet   Oral   Take 650 mg by mouth every 4 (four) hours as needed for mild pain or  fever.         Marland Kitchen allopurinol (ZYLOPRIM) 100 MG tablet   Oral   Take 100 mg by mouth daily.         Marland Kitchen alum & mag hydroxide-simeth (MAALOX PLUS) 400-400-40 MG/5ML suspension   Oral   Take 30 mLs by mouth every 4 (four) hours as needed for indigestion.         Marland Kitchen aspirin 81 MG chewable tablet   Oral   Chew 81 mg by mouth daily.         . Cholecalciferol (VITAMIN D) 2000 UNITS CAPS   Oral   Take 2,000 Units by mouth daily.         . cholestyramine (QUESTRAN) 4 G packet   Oral   Take 2 g by mouth every 12 (twelve) hours as needed (for diarrhea).         . docusate calcium (SURFAK) 240 MG capsule   Oral   Take 240 mg by mouth at bedtime as needed for mild constipation.         . Ferrous Sulfate 140 (45 FE) MG TBCR   Oral   Take 140 mg by mouth daily.         . fexofenadine (ALLEGRA) 60 MG tablet   Oral   Take 60 mg by mouth daily.         Marland Kitchen glucagon 1  MG injection   Intravenous   Inject 1 mg into the vein once as needed (for FSBS <60).         Marland Kitchen guaiFENesin (MUCINEX) 600 MG 12 hr tablet   Oral   Take 600 mg by mouth 2 (two) times daily.         Marland Kitchen guaiFENesin (ROBITUSSIN) 100 MG/5ML SOLN   Oral   Take 10 mLs by mouth every 4 (four) hours as needed for cough.         . insulin aspart protamine- aspart (NOVOLOG MIX 70/30) (70-30) 100 UNIT/ML injection   Subcutaneous   Inject 12-16 Units into the skin 2 (two) times daily. Pt uses 16 units in the morning and 12 units in the evening.         . Ipratropium-Albuterol (COMBIVENT RESPIMAT) 20-100 MCG/ACT AERS respimat   Inhalation   Inhale 1 puff into the lungs 4 (four) times daily.         Marland Kitchen ipratropium-albuterol (DUONEB) 0.5-2.5 (3) MG/3ML SOLN   Nebulization   Take 3 mLs by nebulization every 6 (six) hours as needed (for shortness of breath).         . isosorbide mononitrate (IMDUR) 30 MG 24 hr tablet   Oral   Take 30 mg by mouth daily.         Marland Kitchen lamoTRIgine (LAMICTAL) 100 MG tablet    Oral   Take 50 mg by mouth every 12 (twelve) hours.         Marland Kitchen levothyroxine (SYNTHROID, LEVOTHROID) 75 MCG tablet   Oral   Take 75 mcg by mouth daily.         Marland Kitchen loperamide (IMODIUM) 2 MG capsule   Oral   Take 2-4 mg by mouth as needed for diarrhea or loose stools.         Marland Kitchen losartan (COZAAR) 100 MG tablet   Oral   Take 100 mg by mouth daily.         Marland Kitchen menthol-cetylpyridinium (CEPACOL) 3 MG lozenge   Oral   Take 1 lozenge by mouth as needed for sore throat.         . methocarbamol (ROBAXIN) 500 MG tablet   Oral   Take 500 mg by mouth every 6 (six) hours as needed for muscle spasms.         . nitroGLYCERIN (NITROSTAT) 0.4 MG SL tablet   Sublingual   Place 0.4 mg under the tongue every 5 (five) minutes as needed for chest pain.         . pantoprazole (PROTONIX) 40 MG tablet   Oral   Take 40 mg by mouth 2 (two) times daily.         . phenylephrine-shark liver oil-mineral oil-petrolatum (PREPARATION H) 0.25-3-14-71.9 % rectal ointment   Rectal   Place 1 application rectally 4 (four) times daily as needed for hemorrhoids.         . promethazine (PHENERGAN) 25 MG suppository   Rectal   Place 25 mg rectally every 4 (four) hours as needed for nausea or vomiting.         . promethazine (PHENERGAN) 25 MG tablet   Oral   Take 25 mg by mouth every 4 (four) hours as needed for nausea or vomiting.         . promethazine (PHENERGAN) 25 MG/ML injection   Intravenous   Inject 25 mg into the vein as needed for nausea or vomiting.         Marland Kitchen  rOPINIRole (REQUIP) 0.25 MG tablet   Oral   Take 0.25 mg by mouth at bedtime.         . torsemide (DEMADEX) 10 MG tablet   Oral   Take 10 mg by mouth daily.         Marland Kitchen venlafaxine XR (EFFEXOR-XR) 75 MG 24 hr capsule   Oral   Take 75 mg by mouth daily.         . vitamin B-12 (CYANOCOBALAMIN) 250 MCG tablet   Oral   Take 250 mcg by mouth daily.         . traMADol (ULTRAM) 50 MG tablet   Oral   Take 1 tablet  (50 mg total) by mouth every 6 (six) hours as needed for moderate pain.   20 tablet   0     Allergies Ace inhibitors; Clonidine derivatives; Hytrin; Metformin and related; Minoxidil; and Vitamin d analogs  History reviewed. No pertinent family history.  Social History Social History  Substance Use Topics  . Smoking status: Former Games developer  . Smokeless tobacco: None  . Alcohol Use: No    Review of Systems History is limited from the patient due to her chronic dementia  ____________________________________________   PHYSICAL EXAM:  VITAL SIGNS: ED Triage Vitals  Enc Vitals Group     BP 09/30/15 1700 156/76 mmHg     Pulse Rate 09/30/15 1655 78     Resp 09/30/15 1655 30     Temp 09/30/15 1655 98.1 F (36.7 C)     Temp Source 09/30/15 1655 Oral     SpO2 09/30/15 1655 94 %     Weight 09/30/15 1655 130 lb 8 oz (59.194 kg)     Height 09/30/15 1655  (1.549 m)     Head Cir --      Peak Flow --      Pain Score 09/30/15 1658 8     Pain Loc --      Pain Edu? --      Excl. in GC? --     Constitutional: elderly, right-sided chronic facial droop, no acute distress Eyes: chronic right-sided ptosis.  Left eyes normal-appearing Head: Atraumatic. Nose: No congestion/rhinnorhea. Mouth/Throat: Mucous membranes are moist.  Oropharynx non-erythematous. Neck: No stridor.  No cervical spine tenderness to palpation. Cardiovascular: Normal rate, regular rhythm. Grossly normal heart sounds.  Good peripheral circulation. Respiratory: Normal respiratory effort.  No retractions. Lungs CTAB. Gastrointestinal: Soft and nontender. No distention. No abdominal bruits. No CVA tenderness. Musculoskeletal: tenderness to palpation of the left upper leg/left hip with no deformity.  Normal distal pulses.No lower extremity tenderness nor edema.  No joint effusions. Neurologic:  chronicright-sided facial droop and right eyelid lesion.  No acute abnormalities according to the daughter Skin:  Skin is  warm, dry and intact. No rash noted.   ____________________________________________   LABS (all labs ordered are listed, but only abnormal results are displayed)  Labs Reviewed  CBC WITH DIFFERENTIAL/PLATELET - Abnormal; Notable for the following:    RBC 3.48 (*)    Hemoglobin 10.8 (*)    HCT 32.8 (*)    RDW 15.7 (*)    All other components within normal limits  COMPREHENSIVE METABOLIC PANEL - Abnormal; Notable for the following:    CO2 34 (*)    Glucose, Bld 216 (*)    BUN 29 (*)    Creatinine, Ser 1.80 (*)    ALT 13 (*)    GFR calc non Af Amer 23 (*)  GFR calc Af Amer 27 (*)    All other components within normal limits  TROPONIN I - Abnormal; Notable for the following:    Troponin I 0.10 (*)    All other components within normal limits  URINALYSIS COMPLETEWITH MICROSCOPIC ([facility] ONLY)   ____________________________________________  EKG  ED ECG REPORT I, Ulis Kaps, the attending physician, personally viewed and interpreted this ECG.  Date: 09/30/2015 EKG Time: 17:17 Rate: 79 Rhythm: sinus rhythm with first-degree AV block QRS Axis: normal Intervals: normal ST/T Wave abnormalities: Non-specific ST segment / T-wave changes, but no evidence of acute ischemia. Conduction Disutrbances: none Narrative Interpretation: unremarkable  ____________________________________________  RADIOLOGY   Dg Chest Portable 1 View  09/30/2015   CLINICAL DATA:  Preop left hip fracture  EXAM: PORTABLE CHEST 1 VIEW  COMPARISON:  07/05/2014  FINDINGS: There is diffuse interstitial thickening. There is no focal parenchymal opacity. There is no pleural effusion or pneumothorax. There is stable cardiomegaly. There is thoracic aortic atherosclerosis.  There are multiple old right rib fractures.  IMPRESSION: Cardiomegaly with mild pulmonary vascular congestion.   Electronically Signed   By: Elige Ko   On: 09/30/2015 19:15   Dg Hips Bilat With Pelvis 2v  09/30/2015   CLINICAL DATA:   Status post fall.  EXAM: DG HIP (WITH OR WITHOUT PELVIS) 2V BILAT  COMPARISON:  07/05/2014  FINDINGS: There is generalized osteopenia. There are bilateral hip arthroplasties. There is old posttraumatic deformity of the right superior and inferior pubic rami. There is a nondisplaced fracture of the proximal left femur just inferior to the greater trochanter. There is no other acute fracture or dislocation. There is no lytic or sclerotic osseous lesion.  There is peripheral vascular atherosclerotic disease.  IMPRESSION: 1. Nondisplaced fracture of the proximal left femur just inferior to the greater trochanter.  2.  No acute osseous injury of the right hip.   Electronically Signed   By: Elige Ko   On: 09/30/2015 17:54    ____________________________________________   PROCEDURES  Procedure(s) performed: None  Critical Care performed: No ____________________________________________   INITIAL IMPRESSION / ASSESSMENT AND PLAN / ED COURSE  Pertinent labs & imaging results that were available during my care of the patient were reviewed by me and considered in my medical decision making (see chart for details).  The patient has an acute fracture of her left proximal femur.  I discussed the case by phone with Dr. Ernest Pine who reviewed the x-rays personally.  He stated definitively that this is a nonoperative fracture.  She should remain nonweightbearing, but she is already supposed to be nonweightbearing and is already at a skilled nursing facility.  I discussed this with the patient's daughter, [name], and we discussed goals of care.  I offered that we could obtain blood work and urine tests and hope that we find a reason for admission, but given that she is already cared for at a skilled nursing facility and has numerous chronic medical illnesses and is currently in no acute distress, we could also discharge her back to the care of the facility to follow-up with her primary care doctor and with  orthopedic surgery.  The daughter feels that no additional workup is necessary at this time and she plans to pursue hospice/comfort care discussions with the patient's primary care doctor, Dr. Dareen Piano.  I will transport her back by EMS to the skilled nursing facility.  The patient is able to eat and drink here in the emergency department and is in  no acute distress.  ----------------------------------------- 8:27 PM on 09/30/2015 -----------------------------------------  The labs that were put in earlier on the patient resulted, and she has an elevated troponin.  This likely is a stress leak based on her trauma, but given the elevated troponin and the history of ACS/CAD, I will discuss with the hospitalist the need for admission, serial enzymes, probable orthopedic consult, and possible consultation with palliative care based on my discussion with the patient's daughter.  The patient's daughter had left but I spoke with her by phone and she agrees with this plan. ____________________________________________  FINAL CLINICAL IMPRESSION(S) / ED DIAGNOSES  Final diagnoses:  Closed left subtrochanteric femur fracture, initial encounter (HCC)  Elevated troponin I level      NEW MEDICATIONS STARTED DURING THIS VISIT:  New Prescriptions   TRAMADOL (ULTRAM) 50 MG TABLET    Take 1 tablet (50 mg total) by mouth every 6 (six) hours as needed for moderate pain.     Loleta Rose, MD 09/30/15 2039

## 2015-09-30 NOTE — ED Notes (Signed)
Pt to ED via EMS from [facility] c/o unwitnessed fall and bil hip pain. Pt states she slipped but did not hit her head. No rotation or shortening noted.

## 2015-09-30 NOTE — ED Notes (Signed)
Per pt's daughter pt was walking around at nursing home without calling for assistance, states pt has had R hip fracture before d/t same issue.

## 2015-09-30 NOTE — H&P (Signed)
[facility] - [place] at [facility]   PATIENT NAME: Patricia Cameron    MR#:  [id number]  DATE OF BIRTH:  10-11-1921   DATE OF ADMISSION:  09/30/2015  PRIMARY CARE PHYSICIAN: Lauro Regulus., MD   REQUESTING/REFERRING PHYSICIAN: York Cerise  CHIEF COMPLAINT:   Chief Complaint  Patient presents with  . Fall    HISTORY OF PRESENT ILLNESS:  Patricia Cameron  is a 81 y.o. female with a known history of dementia who is presenting after mechanical fall. The patient is unable to provide meaningful information given mental status which is at baseline. History obtained from family members as well as emergency department staff. Apparently while at her nursing facility despite her nonweightbearing status she decided to attempt to ambulate unfortunately she suffered a mechanical fall. She was noted to be in pain thus brought to the Hospital further workup and evaluation where she was found to be suffering from a left femur fracture. This case was discussed with orthopedic surgery as well as the family who believe a nonoperative course is in order -instead go for a palliative care approach. Of note she was incidentally found to have an elevated troponin during initial workup   PAST MEDICAL HISTORY:   Past Medical History  Diagnosis Date  . COPD (chronic obstructive pulmonary disease) (HCC)   . Dementia   . CHF (congestive heart failure) (HCC)   . Gout   . OSA (obstructive sleep apnea)   . Diabetes mellitus without complication (HCC)   . Macular degeneration   . Bell's palsy   . CKD (chronic kidney disease)   . Thyroid disease     hypothyroidism  . GERD (gastroesophageal reflux disease)   . Hyperlipidemia   . Depression   . Myocardial infarction, old     PAST SURGICAL HISTORY:  History reviewed. No pertinent past surgical history.  SOCIAL HISTORY:   Social History  Substance Use Topics  . Smoking status: Former Games developer  . Smokeless tobacco: Not on  file  . Alcohol Use: No    FAMILY HISTORY:   Family History  Problem Relation Age of Onset  . Diabetes Other     DRUG ALLERGIES:   Allergies  Allergen Reactions  . Ace Inhibitors Other (See Comments)    Reaction:  Unknown   . Clonidine Derivatives Other (See Comments)    Reaction:  Unknown   . Hytrin [Terazosin] Other (See Comments)    Reaction:  Unknown   . Metformin And Related Diarrhea  . Minoxidil Other (See Comments)    Reaction:  Unknown   . Vitamin D Analogs Other (See Comments)    Reaction:  Unknown     REVIEW OF SYSTEMS:  Unable to provide meaningful information given mental status/medical condition    MEDICATIONS AT HOME:   Prior to Admission medications   Medication Sig Start Date End Date Taking? Authorizing Provider  acetaminophen (TYLENOL) 325 MG tablet Take 650 mg by mouth every 4 (four) hours as needed for mild pain or fever.   Yes Historical Provider, MD  allopurinol (ZYLOPRIM) 100 MG tablet Take 100 mg by mouth daily.   Yes Historical Provider, MD  alum & mag hydroxide-simeth (MAALOX PLUS) 400-400-40 MG/5ML suspension Take 30 mLs by mouth every 4 (four) hours as needed for indigestion.   Yes Historical Provider, MD  aspirin 81 MG chewable tablet Chew 81 mg by mouth daily.   Yes Historical Provider, MD  Cholecalciferol (VITAMIN D) 2000 UNITS CAPS Take 2,000 Units by  mouth daily.   Yes Historical Provider, MD  cholestyramine Lanetta Inch) 4 G packet Take 2 g by mouth every 12 (twelve) hours as needed (for diarrhea).   Yes Historical Provider, MD  docusate calcium (SURFAK) 240 MG capsule Take 240 mg by mouth at bedtime as needed for mild constipation.   Yes Historical Provider, MD  Ferrous Sulfate 140 (45 FE) MG TBCR Take 140 mg by mouth daily.   Yes Historical Provider, MD  fexofenadine (ALLEGRA) 60 MG tablet Take 60 mg by mouth daily.   Yes Historical Provider, MD  glucagon 1 MG injection Inject 1 mg into the vein once as needed (for FSBS <60).   Yes  Historical Provider, MD  guaiFENesin (MUCINEX) 600 MG 12 hr tablet Take 600 mg by mouth 2 (two) times daily.   Yes Historical Provider, MD  guaiFENesin (ROBITUSSIN) 100 MG/5ML SOLN Take 10 mLs by mouth every 4 (four) hours as needed for cough.   Yes Historical Provider, MD  insulin aspart protamine- aspart (NOVOLOG MIX 70/30) (70-30) 100 UNIT/ML injection Inject 12-16 Units into the skin 2 (two) times daily. Pt uses 16 units in the morning and 12 units in the evening.   Yes Historical Provider, MD  Ipratropium-Albuterol (COMBIVENT RESPIMAT) 20-100 MCG/ACT AERS respimat Inhale 1 puff into the lungs 4 (four) times daily.   Yes Historical Provider, MD  ipratropium-albuterol (DUONEB) 0.5-2.5 (3) MG/3ML SOLN Take 3 mLs by nebulization every 6 (six) hours as needed (for shortness of breath).   Yes Historical Provider, MD  isosorbide mononitrate (IMDUR) 30 MG 24 hr tablet Take 30 mg by mouth daily.   Yes Historical Provider, MD  lamoTRIgine (LAMICTAL) 100 MG tablet Take 50 mg by mouth every 12 (twelve) hours.   Yes Historical Provider, MD  levothyroxine (SYNTHROID, LEVOTHROID) 75 MCG tablet Take 75 mcg by mouth daily.   Yes Historical Provider, MD  loperamide (IMODIUM) 2 MG capsule Take 2-4 mg by mouth as needed for diarrhea or loose stools.   Yes Historical Provider, MD  losartan (COZAAR) 100 MG tablet Take 100 mg by mouth daily.   Yes Historical Provider, MD  menthol-cetylpyridinium (CEPACOL) 3 MG lozenge Take 1 lozenge by mouth as needed for sore throat.   Yes Historical Provider, MD  methocarbamol (ROBAXIN) 500 MG tablet Take 500 mg by mouth every 6 (six) hours as needed for muscle spasms.   Yes Historical Provider, MD  nitroGLYCERIN (NITROSTAT) 0.4 MG SL tablet Place 0.4 mg under the tongue every 5 (five) minutes as needed for chest pain.   Yes Historical Provider, MD  pantoprazole (PROTONIX) 40 MG tablet Take 40 mg by mouth 2 (two) times daily.   Yes Historical Provider, MD  phenylephrine-shark liver  oil-mineral oil-petrolatum (PREPARATION H) 0.25-3-14-71.9 % rectal ointment Place 1 application rectally 4 (four) times daily as needed for hemorrhoids.   Yes Historical Provider, MD  promethazine (PHENERGAN) 25 MG suppository Place 25 mg rectally every 4 (four) hours as needed for nausea or vomiting.   Yes Historical Provider, MD  promethazine (PHENERGAN) 25 MG tablet Take 25 mg by mouth every 4 (four) hours as needed for nausea or vomiting.   Yes Historical Provider, MD  promethazine (PHENERGAN) 25 MG/ML injection Inject 25 mg into the vein as needed for nausea or vomiting.   Yes Historical Provider, MD  rOPINIRole (REQUIP) 0.25 MG tablet Take 0.25 mg by mouth at bedtime.   Yes Historical Provider, MD  torsemide (DEMADEX) 10 MG tablet Take 10 mg by mouth daily.  Yes Historical Provider, MD  venlafaxine XR (EFFEXOR-XR) 75 MG 24 hr capsule Take 75 mg by mouth daily.   Yes Historical Provider, MD  vitamin B-12 (CYANOCOBALAMIN) 250 MCG tablet Take 250 mcg by mouth daily.   Yes Historical Provider, MD  traMADol (ULTRAM) 50 MG tablet Take 1 tablet (50 mg total) by mouth every 6 (six) hours as needed for moderate pain. 09/30/15 09/29/16  Loleta Rose, MD      VITAL SIGNS:  Blood pressure 184/90, pulse 78, temperature 98.1 F (36.7 C), temperature source Oral, resp. rate 22, height  (1.549 m), weight 130 lb 8 oz (59.194 kg), SpO2 95 %.  PHYSICAL EXAMINATION:  VITAL SIGNS: Filed Vitals:   09/30/15 1929  BP: 184/90  Pulse: 78  Temp:   Resp: 22   GENERAL:81 y.o.female currentmoderateacute distress given mental status however at baseline .  HEAD: Normocephalic, atraumatic.  EYES: Pupils equal, round, reactive to light. Extraocular muscles intact. No scleral icterus.  MOUTH: Moist mucosal membrane. Dentition poor. No abscess noted.  EAR, NOSE, THROAT: Clear without exudates. No external lesions.  NECK: Supple. No thyromegaly. No nodules. No JVD.  PULMONARY: Clear to ascultation, without  wheeze rails or rhonci. No use of accessory muscles, Good respiratory effort. good air entry bilaterally CHEST: Nontender to palpation.  CARDIOVASCULAR: S1 and S2. Regular rate and rhythm. No murmurs, rubs, or gallops. No edema. Pedal pulses 2+ bilaterally.  GASTROINTESTINAL: Soft, nontender, nondistended. No masses. Positive bowel sounds. No hepatosplenomegaly.  MUSCULOSKELETAL: No swelling, clubbing, or edema. Range of motlimited in left lower extremity secondary to fracture NEUROLOGIC: unable to fully assess given patient's mental status/medical condition SKIN: No ulceration, lesions, rashes, or cyanosis. Skin warm and dry. Turgor intact.   psychiatric:Unable to fully assess given patient's mental status/medical condition  LABORATORY PANEL:   CBC  Recent Labs Lab 09/30/15 1719  WBC 8.2  HGB 10.8*  HCT 32.8*  PLT 229   ------------------------------------------------------------------------------------------------------------------  Chemistries   Recent Labs Lab 09/30/15 1719  NA 142  K 4.2  CL 101  CO2 34*  GLUCOSE 216*  BUN 29*  CREATININE 1.80*  CALCIUM 9.2  AST 20  ALT 13*  ALKPHOS 64  BILITOT 0.4   ------------------------------------------------------------------------------------------------------------------  Cardiac Enzymes  Recent Labs Lab 09/30/15 1719  TROPONINI 0.10*   ------------------------------------------------------------------------------------------------------------------  RADIOLOGY:  Dg Chest Portable 1 View  09/30/2015   CLINICAL DATA:  Preop left hip fracture  EXAM: PORTABLE CHEST 1 VIEW  COMPARISON:  07/05/2014  FINDINGS: There is diffuse interstitial thickening. There is no focal parenchymal opacity. There is no pleural effusion or pneumothorax. There is stable cardiomegaly. There is thoracic aortic atherosclerosis.  There are multiple old right rib fractures.  IMPRESSION: Cardiomegaly with mild pulmonary vascular congestion.    Electronically Signed   By: Elige Ko   On: 09/30/2015 19:15   Dg Hips Bilat With Pelvis 2v  09/30/2015   CLINICAL DATA:  Status post fall.  EXAM: DG HIP (WITH OR WITHOUT PELVIS) 2V BILAT  COMPARISON:  07/05/2014  FINDINGS: There is generalized osteopenia. There are bilateral hip arthroplasties. There is old posttraumatic deformity of the right superior and inferior pubic rami. There is a nondisplaced fracture of the proximal left femur just inferior to the greater trochanter. There is no other acute fracture or dislocation. There is no lytic or sclerotic osseous lesion.  There is peripheral vascular atherosclerotic disease.  IMPRESSION: 1. Nondisplaced fracture of the proximal left femur just inferior to the greater trochanter.  2.  No acute osseous injury of the right hip.   Electronically Signed   By: Elige Ko   On: 09/30/2015 17:54    EKG:   Orders placed or performed during the hospital encounter of 09/30/15  . EKG 12-Lead  . EKG 12-Lead    IMPRESSION AND PLAN:   81 year old Caucasian female who is DO NOT RESUSCITATE presenting after mechanical fall  1. Left femur fracture initial visit: Case discussed with    orthopedic surgery in emergency department deemed nonoperative, consult palliative care provide pain medication as well as bowel regimen 2. Elevated troponin: Telemetry, cardiac enzymes, aspirin 3. GERD without esophagitis PPI therapy 4. Essential hypertension: Restart home medications added as needed hydralazine 5. Type 2 diabetes insulin requiring: Continue basal insulin at insulin sliding scale 6. Venous thromboembolism prophylactic: Heparin subcutaneous as nonoperative  All the records are reviewed and case discussed with ED provider. Management plans discussed with the patient, family and they are in agreement.  CODE STATUS:DO NOT RESUSCITATE TOTAL TIME TAKING CARE OF THIS PATIENT21minutes.    Hower,  Mardi Mainland.D on 09/30/2015 at 9:00 PM  Between 7am to 6pm  - Pager - [phone]  After 6pm: House Pager: - [phone]  [facility]  Office  [phone]  CC: Primary care physician; Lauro Regulus., MD

## 2015-10-01 DIAGNOSIS — I248 Other forms of acute ischemic heart disease: Secondary | ICD-10-CM

## 2015-10-01 DIAGNOSIS — I517 Cardiomegaly: Secondary | ICD-10-CM

## 2015-10-01 DIAGNOSIS — J449 Chronic obstructive pulmonary disease, unspecified: Secondary | ICD-10-CM

## 2015-10-01 DIAGNOSIS — Z794 Long term (current) use of insulin: Secondary | ICD-10-CM

## 2015-10-01 DIAGNOSIS — S7222XA Displaced subtrochanteric fracture of left femur, initial encounter for closed fracture: Principal | ICD-10-CM

## 2015-10-01 DIAGNOSIS — R32 Unspecified urinary incontinence: Secondary | ICD-10-CM

## 2015-10-01 DIAGNOSIS — H3530 Unspecified macular degeneration: Secondary | ICD-10-CM

## 2015-10-01 DIAGNOSIS — Z66 Do not resuscitate: Secondary | ICD-10-CM

## 2015-10-01 DIAGNOSIS — H9193 Unspecified hearing loss, bilateral: Secondary | ICD-10-CM

## 2015-10-01 DIAGNOSIS — Z515 Encounter for palliative care: Secondary | ICD-10-CM

## 2015-10-01 DIAGNOSIS — E039 Hypothyroidism, unspecified: Secondary | ICD-10-CM

## 2015-10-01 DIAGNOSIS — Z8669 Personal history of other diseases of the nervous system and sense organs: Secondary | ICD-10-CM

## 2015-10-01 DIAGNOSIS — K5900 Constipation, unspecified: Secondary | ICD-10-CM

## 2015-10-01 DIAGNOSIS — Z8781 Personal history of (healed) traumatic fracture: Secondary | ICD-10-CM

## 2015-10-01 DIAGNOSIS — F329 Major depressive disorder, single episode, unspecified: Secondary | ICD-10-CM

## 2015-10-01 DIAGNOSIS — I252 Old myocardial infarction: Secondary | ICD-10-CM

## 2015-10-01 DIAGNOSIS — F0390 Unspecified dementia without behavioral disturbance: Secondary | ICD-10-CM

## 2015-10-01 DIAGNOSIS — I739 Peripheral vascular disease, unspecified: Secondary | ICD-10-CM

## 2015-10-01 DIAGNOSIS — Z7901 Long term (current) use of anticoagulants: Secondary | ICD-10-CM

## 2015-10-01 DIAGNOSIS — Z87440 Personal history of urinary (tract) infections: Secondary | ICD-10-CM

## 2015-10-01 DIAGNOSIS — I2510 Atherosclerotic heart disease of native coronary artery without angina pectoris: Secondary | ICD-10-CM

## 2015-10-01 DIAGNOSIS — E785 Hyperlipidemia, unspecified: Secondary | ICD-10-CM

## 2015-10-01 DIAGNOSIS — I10 Essential (primary) hypertension: Secondary | ICD-10-CM

## 2015-10-01 DIAGNOSIS — Z9181 History of falling: Secondary | ICD-10-CM

## 2015-10-01 DIAGNOSIS — Z79899 Other long term (current) drug therapy: Secondary | ICD-10-CM

## 2015-10-01 DIAGNOSIS — R7989 Other specified abnormal findings of blood chemistry: Secondary | ICD-10-CM

## 2015-10-01 DIAGNOSIS — Z87891 Personal history of nicotine dependence: Secondary | ICD-10-CM

## 2015-10-01 DIAGNOSIS — K219 Gastro-esophageal reflux disease without esophagitis: Secondary | ICD-10-CM

## 2015-10-01 DIAGNOSIS — E119 Type 2 diabetes mellitus without complications: Secondary | ICD-10-CM

## 2015-10-01 LAB — GLUCOSE, CAPILLARY
GLUCOSE-CAPILLARY: 150 mg/dL — AB (ref 65–99)
GLUCOSE-CAPILLARY: 68 mg/dL (ref 65–99)
Glucose-Capillary: 188 mg/dL — ABNORMAL HIGH (ref 65–99)
Glucose-Capillary: 132 mg/dL — ABNORMAL HIGH (ref 65–99)
Glucose-Capillary: 105 mg/dL — ABNORMAL HIGH (ref 65–99)

## 2015-10-01 LAB — URINALYSIS COMPLETE WITH MICROSCOPIC (ARMC ONLY)
Bilirubin Urine: NEGATIVE
GLUCOSE, UA: NEGATIVE mg/dL
Ketones, ur: NEGATIVE mg/dL
NITRITE: NEGATIVE
PROTEIN: 30 mg/dL — AB
Specific Gravity, Urine: 1.015 (ref 1.005–1.030)
pH: 5 (ref 5.0–8.0)

## 2015-10-01 LAB — TROPONIN I
Troponin I: 0.11 ng/mL — ABNORMAL HIGH (ref <0.031)
Troponin I: 0.09 ng/mL — ABNORMAL HIGH (ref <0.031)

## 2015-10-01 LAB — MRSA PCR SCREENING: MRSA BY PCR: NEGATIVE

## 2015-10-01 MED ORDER — ENSURE ENLIVE PO LIQD
237.0000 mL | Freq: Two times a day (BID) | ORAL | Status: DC
  Administered 2015-10-01 – 2015-10-03 (×5): 237 mL via ORAL

## 2015-10-01 MED ORDER — INFLUENZA VAC SPLIT QUAD 0.5 ML IM SUSY
0.5000 mL | PREFILLED_SYRINGE | INTRAMUSCULAR | Status: DC
  Filled 2015-10-01: qty 0.5

## 2015-10-01 MED ORDER — ALPRAZOLAM 0.25 MG PO TABS: 0.2500 mg | ORAL_TABLET | Freq: Three times a day (TID) | ORAL | Status: DC | PRN

## 2015-10-01 NOTE — Progress Notes (Signed)
Request for evaluation for hospice services received from Palliative Medicine physician Dr. Orvan Falconerampbell following her conversation with patient's daughter Candis MusaChris Taylor. Ms. Blase MessZoller has received hospice services in the past from [facility] [place] [facility], [facility] by the Palliative [facility].   Patient was  admitted on 10/11 for evaluation of left hip pain after a mechanical fall at the nursing facility. Hip xray showed a nondisplaced fracture of the proximal left femur.   Per chart review no surgery is planned. Pain control is the main focus. She has required one dose of tylenol and one dose of 2 mg IV morphine since midnight, for pain.  Per discussion with [facility] [place], Medical director Dr. Erlinda Hongisco Hodges, chart note review and discussion with Palliative Medicine physician Dr. Orvan Falconerampbell, patient does not at this time meet hospice criteria. Plan is for patient to [facility] and continue to be followed by the Palliative Nurse Practitioner.  Writer spoke with patient's daughter Candis MusaChris Taylor, via phone call to advise her of this plan, she voiced understanding and agreement. CSW Fredric MareBailey, [facility] HospitalCMRN Marylene LandAngela and DR. Orvan FalconerCampbell all made aware. Thank you. Dayna BarkerKaren Robertson RN, BSN, [facility] [place], [facility] Liaison [phone] c

## 2015-10-01 NOTE — Plan of Care (Signed)
Problem: Phase I Progression Outcomes Goal: Pain controlled with appropriate interventions Outcome: Progressing Pain control this shift. No complaints of pain Goal: Voiding-avoid urinary catheter unless indicated Outcome: Progressing Pt is incontanant of urine.

## 2015-10-01 NOTE — Clinical Documentation Improvement (Signed)
Hospitalist    (if you agree, please document the response in the progress notes and discharge summary, not on the query note itself.)  "CKD" is document in the current medical record.  If possible, please document the stage of the patient's CKD:   CKD Stage I - GFR greater than or equal to 90  CKD Stage II - GFR 60-89  CKD Stage III - GFR 30-59  CKD Stage IV - GFR 15-29  CKD Stage V - GFR < 15  ESRD (End Stage Renal Disease)  Other condition  Unable to clinically determine   Please exercise your independent, professional judgment when responding. A specific answer is not anticipated or expected.  Thank You, Jerral Ralphathy R Gahel Safley  RN BSN CCDS [phone] Health Information Management [place]

## 2015-10-01 NOTE — Progress Notes (Signed)
Spoke with Dr. Sheryle Hailiamond pt is incontinent  of urine. May do an in and out cath.

## 2015-10-01 NOTE — Progress Notes (Signed)
Patient resting in bed, incontinent care given as needed,seen by palliative care today, head of the bed elevated with feeding and medications- diet changed to dysphagia 3 diet this am due to patient coughing when thin liquids given. IV fluid discontinued. Swallowing evaluation pending.

## 2015-10-01 NOTE — Progress Notes (Signed)
[facility] - [place] at [facility]   PATIENT NAME: Patricia Cameron    MR#:  [id number]  DATE OF BIRTH:  [birth year]  SUBJECTIVE:  CHIEF COMPLAINT:   Chief Complaint  Patient presents with  . Fall   -Patient is a long-term resident of [facility]. Has significant dementia. Admitted after a fall and left hip fracture. -Family opted for no surgery. Recommended pain control and hospice follow-up. Daughter at bedside.  REVIEW OF SYSTEMS:  Review of Systems  Unable to perform ROS: dementia    DRUG ALLERGIES:   Allergies  Allergen Reactions  . Ace Inhibitors Other (See Comments)    Reaction:  Unknown   . Clonidine Derivatives Other (See Comments)    Reaction:  Unknown   . Hytrin [Terazosin] Other (See Comments)    Reaction:  Unknown   . Metformin And Related Diarrhea  . Minoxidil Other (See Comments)    Reaction:  Unknown   . Vitamin D Analogs Other (See Comments)    Reaction:  Unknown     VITALS:  Blood pressure 103/40, pulse 71, temperature 98.8 F (37.1 C), temperature source Oral, resp. rate 18, height  (1.549 m), weight 59.648 kg (131 lb 8 oz), SpO2 100 %.  PHYSICAL EXAMINATION:  Physical Exam  GENERAL:  81 y.o.-year-old patient lying in the bed with no acute distress.  EYES: Left Pupil round, reactive to light and accommodation. Significant right eye ptosis from previous craniotomy and surgery for brain tumor. No scleral icterus. Extraocular muscles intact.  HEENT: Head atraumatic, right-sided facial droop and ptosis of right eye from previous brain surgery and craniotomy.  Oropharynx and nasopharynx clear.  NECK:  Supple, no jugular venous distention. No thyroid enlargement, no tenderness.  LUNGS: no wheezing, rales,rhonchi or crepitation. No use of accessory muscles of respiration. Coarse breath sounds bilaterally, decreased at the bases. CARDIOVASCULAR: S1, S2 normal. No rubs, or gallops. 3/6 systolic murmur present ABDOMEN:  Soft, nontender, nondistended. Bowel sounds present. No organomegaly or mass.  EXTREMITIES: No pedal edema, cyanosis, or clubbing. Right lower extremity is internally rotated. NEUROLOGIC: Right facial droop noted which is chronic. Not following commands due to her dementia. Sensation seems to intact. Able to move both upper extremities. Gait not checked.  PSYCHIATRIC: The patient is alert and oriented to self.  SKIN: No obvious rash, lesion, or ulcer.    LABORATORY PANEL:   CBC  Recent Labs Lab 09/30/15 1719  WBC 8.2  HGB 10.8*  HCT 32.8*  PLT 229   ------------------------------------------------------------------------------------------------------------------  Chemistries   Recent Labs Lab 09/30/15 1719  NA 142  K 4.2  CL 101  CO2 34*  GLUCOSE 216*  BUN 29*  CREATININE 1.80*  CALCIUM 9.2  AST 20  ALT 13*  ALKPHOS 64  BILITOT 0.4   ------------------------------------------------------------------------------------------------------------------  Cardiac Enzymes  Recent Labs Lab 10/01/15 0947  TROPONINI 0.09*   ------------------------------------------------------------------------------------------------------------------  RADIOLOGY:  Dg Chest Portable 1 View  09/30/2015  CLINICAL DATA:  Preop left hip fracture EXAM: PORTABLE CHEST 1 VIEW COMPARISON:  07/05/2014 FINDINGS: There is diffuse interstitial thickening. There is no focal parenchymal opacity. There is no pleural effusion or pneumothorax. There is stable cardiomegaly. There is thoracic aortic atherosclerosis. There are multiple old right rib fractures. IMPRESSION: Cardiomegaly with mild pulmonary vascular congestion. Electronically Signed   By: Elige Ko   On: 09/30/2015 19:15   Dg Hips Bilat With Pelvis 2v  09/30/2015  CLINICAL DATA:  Status post fall. EXAM: DG HIP (WITH OR  WITHOUT PELVIS) 2V BILAT COMPARISON:  07/05/2014 FINDINGS: There is generalized osteopenia. There are bilateral hip  arthroplasties. There is old posttraumatic deformity of the right superior and inferior pubic rami. There is a nondisplaced fracture of the proximal left femur just inferior to the greater trochanter. There is no other acute fracture or dislocation. There is no lytic or sclerotic osseous lesion. There is peripheral vascular atherosclerotic disease. IMPRESSION: 1. Nondisplaced fracture of the proximal left femur just inferior to the greater trochanter. 2.  No acute osseous injury of the right hip. Electronically Signed   By: Elige KoHetal  Patel   On: 09/30/2015 17:54    EKG:   Orders placed or performed during the hospital encounter of 09/30/15  . EKG 12-Lead  . EKG 12-Lead    ASSESSMENT AND PLAN:   81 year old female with past medical history significant for history of dementia, chronic right facial droop due to previous brain surgery, COPD, congestive heart failure, CK D, hypertension presents to the hospital after a fall and noted to have left hip fracture.  #1 left femur fracture-family opted for nonoperative management at this time. -Appreciate altering foot. Pain control is essential. Palliative care consult for hospice follow-up as outpatient. -Anxiety medications added as well  #2 dysphagia-we'll get speech therapy. For now started on honey thick liquids and dysphagia diet.  #3 congestive heart failure-chest x-ray with chronic fibrosis changes and no acute exacerbation of CHF. -Clinically patient has productive cough, though according to daughter its chronic. -Follow up chest x-ray in a.m. Continue torsemide. -Discontinue IV fluids -Also speech therapy consulted  #4 diabetes mellitus-patient on 70/30 NovoLog mix twice a day, will continue that. Also on sliding scale insulin.  #5 hypertension-continue Imdur and losartan  #6 depression and dementia and seems to be at baseline. Continue her own medications-on Effexor, Lamictal  #7 DVT prophylaxis-on subcutaneous heparin  Plan is to  [facility], once pain is better controlled  All the records are reviewed and case discussed with Care Management/Social Workerr. Management plans discussed with the patient, family and they are in agreement.  CODE STATUS: DO NOT RESUSCITATE  TOTAL TIME TAKING CARE OF THIS PATIENT: 36 minutes.   POSSIBLE D/C IN 1-2 DAYS, DEPENDING ON CLINICAL CONDITION.   Enid BaasKALISETTI,Monaca Wadas M.D on 10/01/2015 at 2:40 PM  Between 7am to 6pm - Pager - [phone]  After 6pm go to [email or web address] - password [email or web address] [facility]  [facility]  Office  [phone]  CC: Primary care physician; Lauro RegulusANDERSON,MARSHALL W., MD

## 2015-10-01 NOTE — Care Management (Signed)
[facility] is [facility]. RNCM will follow along with CSW will follow.

## 2015-10-01 NOTE — Consult Note (Signed)
Palliative Medicine Inpatient Consult Note   Name: Patricia Cameron Date: 10/01/2015 MRN: [id number]  DOB: 10-07-1921  Referring Physician: Enid Baas, MD  Palliative Care consult requested for this 81 y.o. female for goals of medical therapy in patient with a left hip fracture following an unwitnessed fall at [facility].  It is unclear as to what level of care she is currently under at the facility but she appears to be at long term care level of care (She has Medicare and Tricare for [employer]).  She has dementia that is advanced and was supposed to be nonweightbearing but decided to ambulate and fell.  Orthopedic surgeon spoke with family and they desided on a more palliative approach. She also has an incidental finding of a minimally elevated troponin at 0.09.  She is currently unresponsive and no family is in room, so I will be calling them. Pt s BP is 103/40 with pulse 57 and resp 16 and temp 98.8.    IMPRESSION: 1.  Left Hip Fracture ---due to mechanical fall ---no surgery to be done but palliative approach is chosen due to patient's advanced dementia and nonambulatory status at baseline. 2.  DM-2 3.  Essential HTN 4.  GERD 5.  Prevention of clots --on heparin SQ 6.  Elevated troponin due to demand ischemia most likely 7.  Very recent UTI 8.  Advanced Age 15.  Moderate  Dementia ---with delusional disorder 10.  H/o Right hip fx 11.  Very hard of hearing --worse on right side 12.  COPD (Former smoker) 13.  History of systolic CHF 14.  History of Brain tumor with surgery ---with sequelae of right sided facial droop  15.  Macular Degeneration 16.  Hypothyroidism 17.  Dyslipidemia 18. Depression 19.  CAD with h/o MI 20.  Gout 21.  Seasonal Allergies 22.  On Lamictal (unsure what this is treating) 23.  Hemorrhoids 24.  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  Cardiomegaly with mild pulmonary vascular  congestion on adm CXR 26.  PVD27.  Constipation 27.  Incontinent of urine   TODAY'S DISCUSSIONS AND DECISIONS:  1.  Pt is already DNR.  A portable DNR form is in chart.   2.  Daughter is HCPOA per her report. There is not a copy in chart. Have asked daughter to bring this in.  There are two sons of pt in [place].   3.  Pt had previously been under the care of [facility] (its been over 1.5 years though).  Not sure of hospice diagnosis at that time.  4.  I spent a long time talking with pts daughter on the phone today.  We had to go through the pts history, current labs, current diagnoses, status of dementia ( She is NOT at end stage of dementia but does have delusions now with her dementia).  Her gait disorder is related to the benign brain tumor and surgery for that tumor --and not associated with her dementia diagnosis.  Some people may be biased and think that b/c she is supposed to be someone with very advanced dementia (with limited speech and dysphagia as well as nonambulatory status). But, her gait disorder is due to the tumor and she still talks quite a lot (per daughter) and she is on a regular texture diet at [facility].  She has had some coughing with a meal so far though--here so this has to be reassessed. Marland Kitchen  4.  I reviewed the note from ED physician quoting Dr Ernest Pine, orthopedist, as saying 'definitively this is a nonoperative fracture'.  It is not a displaced fracture.  Daughter, Thayer Ohm, was glad to have me review this note with her, so we could both understand the rationale for hip repair not being done.  She is satisfied with this review of the record and no surgery or ortho consult will be needed.  Pt WAS walking with a walker from bed to sink/ toilet --even though she wasn't supposed to be (due to balance problems since brain tumor surgery).   5.  I have asked Hospice Liaison to help me with evaluating pt for POSSIBLE (not definite) Hospice Care back at  [facility]. She will need pain mgmt etc.  Note that daughter said this was the Hospice that took care of pt before and they would want this hospice agency back. At that point I spoke with Care Mgmt and then we got a Hospice consult for Hospice Liaison, Dayna Barker.     REVIEW OF SYSTEMS:  Patient is not able to provide ROS  SPIRITUAL SUPPORT SYSTEM: Yes.  SOCIAL HISTORY:  reports that she has quit smoking. She does not have any smokeless tobacco history on file. She reports that she does not drink alcohol.  LEGAL DOCUMENTS:  Portable DNR is in chart The HCPOA form is to be brought here by daughter.    CODE STATUS: DNR  PAST MEDICAL HISTORY: Past Medical History  Diagnosis Date  . COPD (chronic obstructive pulmonary disease) (HCC)   . Dementia   . CHF (congestive heart failure) (HCC)   . Gout   . OSA (obstructive sleep apnea)   . Diabetes mellitus without complication (HCC)   . Macular degeneration   . Bell's palsy   . CKD (chronic kidney disease)   . Thyroid disease     hypothyroidism  . GERD (gastroesophageal reflux disease)   . Hyperlipidemia   . Depression   . Myocardial infarction, old     PAST SURGICAL HISTORY: History reviewed. No pertinent past surgical history.  ALLERGIES:  is allergic to ace inhibitors; clonidine derivatives; hytrin; metformin and related; minoxidil; and vitamin d analogs.  MEDICATIONS:  Current Facility-Administered Medications  Medication Dose Route Frequency Provider Last Rate Last Dose  . 0.9 %  sodium chloride infusion   Intravenous Continuous Wyatt Haste, MD 75 mL/hr at 10/01/15 1216    . acetaminophen (TYLENOL) tablet 650 mg  650 mg Oral Q6H PRN Wyatt Haste, MD   650 mg at 10/01/15 1238   Or  . acetaminophen (TYLENOL) suppository 650 mg  650 mg Rectal Q6H PRN Wyatt Haste, MD      . allopurinol (ZYLOPRIM) tablet 100 mg  100 mg Oral Daily Wyatt Haste, MD   100 mg at 10/01/15 1007  . alum & mag hydroxide-simeth (MAALOX/MYLANTA)  200-200-20 MG/5ML suspension 30 mL  30 mL Oral Q4H PRN Wyatt Haste, MD      . aspirin chewable tablet 81 mg  81 mg Oral Daily Wyatt Haste, MD   81 mg at 10/01/15 1005  . cholecalciferol (VITAMIN D) tablet 2,000 Units  2,000 Units Oral Daily Wyatt Haste, MD   2,000 Units at 10/01/15 1006  . cholestyramine (QUESTRAN) packet 2 g  2 g Oral Q12H PRN Wyatt Haste, MD      . cyanocobalamin tablet 250 mcg  250 mcg Oral Daily Wyatt Haste, MD   250 mcg  at 10/01/15 1005  . docusate sodium (COLACE) capsule 100 mg  100 mg Oral BID Wyatt Haste, MD   100 mg at 10/01/15 1004  . feeding supplement (ENSURE ENLIVE) (ENSURE ENLIVE) liquid 237 mL  237 mL Oral BID BM Enid Baas, MD   237 mL at 10/01/15 1238  . ferrous sulfate tablet 325 mg  325 mg Oral Daily Wyatt Haste, MD   325 mg at 10/01/15 1006  . guaiFENesin (MUCINEX) 12 hr tablet 600 mg  600 mg Oral BID Wyatt Haste, MD   600 mg at 10/01/15 1004  . guaiFENesin (ROBITUSSIN) 100 MG/5ML solution 200 mg  10 mL Oral Q4H PRN Wyatt Haste, MD      . heparin injection 5,000 Units  5,000 Units Subcutaneous 3 times per day Wyatt Haste, MD   5,000 Units at 10/01/15 0513  . hydrALAZINE (APRESOLINE) injection 10 mg  10 mg Intravenous Q4H PRN Wyatt Haste, MD      . Melene Muller ON 10/02/2015] Influenza vac split quadrivalent PF (FLUARIX) injection 0.5 mL  0.5 mL Intramuscular Tomorrow-1000 Wyatt Haste, MD      . insulin aspart (novoLOG) injection 0-5 Units  0-5 Units Subcutaneous QHS Wyatt Haste, MD   2 Units at 09/30/15 2252  . insulin aspart (novoLOG) injection 0-9 Units  0-9 Units Subcutaneous TID WC Wyatt Haste, MD   1 Units at 10/01/15 1238  . insulin aspart protamine- aspart (NOVOLOG MIX 70/30) injection 12 Units  12 Units Subcutaneous QPM Wyatt Haste, MD   12 Units at 09/30/15 2253  . insulin aspart protamine- aspart (NOVOLOG MIX 70/30) injection 16 Units  16 Units Subcutaneous q morning - 10a Wyatt Haste, MD   16 Units at 10/01/15 1004  .  ipratropium-albuterol (DUONEB) 0.5-2.5 (3) MG/3ML nebulizer solution 3 mL  3 mL Nebulization Q6H PRN Wyatt Haste, MD      . isosorbide mononitrate (IMDUR) 24 hr tablet 30 mg  30 mg Oral Daily Wyatt Haste, MD   30 mg at 10/01/15 1006  . lamoTRIgine (LAMICTAL) tablet 50 mg  50 mg Oral Q12H Wyatt Haste, MD   50 mg at 10/01/15 1004  . levothyroxine (SYNTHROID, LEVOTHROID) tablet 75 mcg  75 mcg Oral QAC breakfast Wyatt Haste, MD   75 mcg at 10/01/15 [phone]  . loratadine (CLARITIN) tablet 10 mg  10 mg Oral Daily Wyatt Haste, MD   10 mg at 10/01/15 1004  . losartan (COZAAR) tablet 100 mg  100 mg Oral Daily Wyatt Haste, MD   100 mg at 10/01/15 1006  . methocarbamol (ROBAXIN) tablet 500 mg  500 mg Oral Q6H PRN Wyatt Haste, MD      . morphine 2 MG/ML injection 2 mg  2 mg Intravenous Q4H PRN Wyatt Haste, MD   2 mg at 10/01/15 0546  . ondansetron (ZOFRAN) tablet 4 mg  4 mg Oral Q6H PRN Wyatt Haste, MD       Or  . ondansetron [facility]) injection 4 mg  4 mg Intravenous Q6H PRN Wyatt Haste, MD      . oxyCODONE (Oxy IR/ROXICODONE) immediate release tablet 5 mg  5 mg Oral Q4H PRN Wyatt Haste, MD      . pantoprazole (PROTONIX) EC tablet 40 mg  40 mg Oral BID Wyatt Haste, MD   40 mg at 10/01/15 1005  . polyethylene glycol (MIRALAX / GLYCOLAX) packet 17  g  17 g Oral Daily PRN Wyatt Hasteavid K Hower, MD      . rOPINIRole (REQUIP) tablet 0.25 mg  0.25 mg Oral QHS Wyatt Hasteavid K Hower, MD   0.25 mg at 09/30/15 2254  . sodium chloride 0.9 % injection 3 mL  3 mL Intravenous Q12H Wyatt Hasteavid K Hower, MD   3 mL at 10/01/15 1007  . torsemide (DEMADEX) tablet 10 mg  10 mg Oral Daily Wyatt Hasteavid K Hower, MD   10 mg at 10/01/15 1005  . venlafaxine XR (EFFEXOR-XR) 24 hr capsule 75 mg  75 mg Oral Daily Wyatt Hasteavid K Hower, MD   75 mg at 10/01/15 1005    Vital Signs: BP 103/40 mmHg  Pulse 71  Temp(Src) 98.8 F (37.1 C) (Oral)  Resp 18  Ht 5\' 1"  (1.549 m)  Wt 59.648 kg (131 lb 8 oz)  BMI 24.86 kg/m2  SpO2 100% Filed Weights    09/30/15 1655 09/30/15 2248  Weight: 59.194 kg (130 lb 8 oz) 59.648 kg (131 lb 8 oz)    Estimated body mass index is 24.86 kg/(m^2) as calculated from the following:   Height as of this encounter: 5\' 1"  (1.549 m).   Weight as of this encounter: 59.648 kg (131 lb 8 oz).  PERFORMANCE STATUS (ECOG) : 4 - Bedbound  PHYSICAL EXAM: Sleeping soundly (daughter says she often is hard to waken) EOMI OP clear Neck w/o JVD or TM Heart rrr no mgr Lungs cta no rales Abd soft and NT Ext not moved due to fx  Skin warm and dry      LABS: CBC:    Component Value Date/Time   WBC 8.2 09/30/2015 1719   WBC 10.4 01/07/2015 0740   HGB 10.8* 09/30/2015 1719   HGB 10.0* 01/07/2015 0740   HCT 32.8* 09/30/2015 1719   HCT 31.5* 01/07/2015 0740   PLT 229 09/30/2015 1719   PLT 290 01/07/2015 0740   MCV 94.3 09/30/2015 1719   MCV 99 01/07/2015 0740   NEUTROABS 6.0 09/30/2015 1719   NEUTROABS 5.7 01/07/2015 0740   LYMPHSABS 1.3 09/30/2015 1719   LYMPHSABS 3.0 01/07/2015 0740   MONOABS 0.6 09/30/2015 1719   MONOABS 0.9 01/07/2015 0740   EOSABS 0.3 09/30/2015 1719   EOSABS 0.7 01/07/2015 0740   BASOSABS 0.0 09/30/2015 1719   BASOSABS 0.1 01/07/2015 0740   Comprehensive Metabolic Panel:    Component Value Date/Time   NA 142 09/30/2015 1719   NA 139 01/07/2015 0740   K 4.2 09/30/2015 1719   K 5.3* 01/07/2015 0740   CL 101 09/30/2015 1719   CL 107 01/07/2015 0740   CO2 34* 09/30/2015 1719   CO2 26 01/07/2015 0740   BUN 29* 09/30/2015 1719   BUN 75* 01/07/2015 0740   CREATININE 1.80* 09/30/2015 1719   CREATININE 2.84* 01/07/2015 0740   GLUCOSE 216* 09/30/2015 1719   GLUCOSE 123* 01/07/2015 0740   CALCIUM 9.2 09/30/2015 1719   CALCIUM 8.6 01/07/2015 0740   AST 20 09/30/2015 1719   AST 17 01/07/2015 0740   ALT 13* 09/30/2015 1719   ALT 15 01/07/2015 0740   ALKPHOS 64 09/30/2015 1719   ALKPHOS 86 01/07/2015 0740   BILITOT 0.4 09/30/2015 1719   BILITOT 0.3 01/07/2015 0740   PROT 6.8  09/30/2015 1719   PROT 6.8 01/07/2015 0740   ALBUMIN 3.7 09/30/2015 1719   ALBUMIN 3.4 01/07/2015 0740    More than 50% of the visit was spent in counseling/coordination of care: Yes  Time Spent:  80 minutes

## 2015-10-01 NOTE — Clinical Social Work Note (Signed)
Clinical Social Work Assessment  Patient Details  Name: Patricia Cameron MRN: [id number] Date of Birth: 05-24-1921  Date of referral:  10/01/15               Reason for consult:  Facility Placement, Other (Comment Required) ([facility] )                Permission sought to share information with:  Oceanographeracility Contact Representative Permission granted to share information::  Yes, Verbal Permission Granted  Name::      [facility]  Agency::   Skilled Nursing Facility   Relationship::     Contact Information:     Housing/Transportation Living arrangements for the past 2 months:  Skilled Building surveyorursing Facility Source of Information:  Power of [place] VerdeAttorney, Adult Children Patient Interpreter Needed:  None Criminal Activity/Legal Involvement Pertinent to Current Situation/Hospitalization:  No - Comment as needed Significant Relationships:  Adult Children Lives with:  Facility Resident Do you feel safe going back to the place where you live?  Yes Need for family participation in patient care:  Yes (Comment)  Care giving concerns: Patient is a [facility].    Social Worker assessment / plan: Visual merchandiserClinical Social Worker (CSW) received consult that [facility] is [facility]. CSW attempted to meet with patient however she was asleep. CSW contacted patient's daughter HPOA Thayer OhmChris. Per daughter patient is a [facility]. [facility]. Per daughter she has 2 brothers and she will bring HPOA paper work to the hospital. Patient is agreeable for patient to [facility]. Patient is currently being [facility].   Per Kim admissions coordinator at [facility] patient can return. FL2 complete and on chart.   Employment status:  Disabled (Comment on whether or not currently receiving Disability), Retired Database administratornsurance information:  Managed Medicare PT Recommendations:   Skilled Nursing Facility Information / Referral to community resources:  Skilled Nursing Facility  Patient/Family's Response to care: Daughter is agreeable for patient to return to [facility].   Patient/Family's Understanding of and Emotional Response to Diagnosis, Current Treatment, and Prognosis: Daughter was pleasant and thanked CSW for assistance.   Emotional Assessment Appearance:    Attitude/Demeanor/Rapport:    Affect (typically observed):  Accepting, Adaptable, Pleasant Orientation:  Fluctuating Orientation (Suspected and/or reported Sundowners) Alcohol / Substance use:  Not Applicable Psych involvement (Current and /or in the community):  No (Comment)  Discharge Needs  Concerns to be addressed:  Discharge Planning Concerns Readmission within the last 30 days:  No Current discharge risk:  Chronically ill Barriers to Discharge:  Continued Medical Work up   Haig ProphetMorgan, Daryl Beehler G, LCSW 10/01/2015, 3:50 PM

## 2015-10-01 NOTE — Progress Notes (Signed)
Initial Nutrition Assessment   INTERVENTION:   Meals and Snacks: Cater to patient preferences. Recommend Dysphagia III as pt reports difficulty chewing at times.  Medical Food Supplement Therapy: will recommend Ensure Enlive po BID, each supplement provides 350 kcal and 20 grams of protein. Will monitor blood glucose, if elevated can swithc to Glucerna. Coordination of Care: if pt at risk for aspiration, recommend SLP evaulation. CNA agreeable to monitor at lunch today. Pt taking pills in applesauce on visit.   NUTRITION DIAGNOSIS:   Biting/chewing difficulty related to chronic illness as evidenced by per patient/family report.  GOAL:   Patient will meet greater than or equal to 90% of their needs  MONITOR:    (Energy Intake, Electrolyte and renal Profile, Anthropometrics, Digestive system)  REASON FOR ASSESSMENT:   Malnutrition Screening Tool    ASSESSMENT:   Pt admitted with left femur fracture s/p fall, nonoperative per MD note. Pt with h/o dementia.  Past Medical History  Diagnosis Date  . COPD (chronic obstructive pulmonary disease) (HCC)   . Dementia   . CHF (congestive heart failure) (HCC)   . Gout   . OSA (obstructive sleep apnea)   . Diabetes mellitus without complication (HCC)   . Macular degeneration   . Bell's palsy   . CKD (chronic kidney disease)   . Thyroid disease     hypothyroidism  . GERD (gastroesophageal reflux disease)   . Hyperlipidemia   . Depression   . Myocardial infarction, old     Diet Order:  DIET DYS 3 Room service appropriate?: Yes; Fluid consistency:: Thin    Current Nutrition: Pt started eating eggs this am when CNA rounded. Pt ate 2 bites of eggs. When CNA came back to check on pt eating, tray was already taken.   Food/Nutrition-Related History: Difficult to clarify with pt on visit. Per MST pt appetite was not decreased PTA. Pt reports like Ensure (chocolate).    Medications: vitamin D, cyanocobalamin, colace, ferrous  sulfate, ss novolog, novolog 70/30, protonix, NS at 7275mL/hr  Electrolyte/Renal Profile and Glucose Profile:   Recent Labs Lab 09/30/15 1719  NA 142  K 4.2  CL 101  CO2 34*  BUN 29*  CREATININE 1.80*  CALCIUM 9.2  GLUCOSE 216*   Protein Profile:  Recent Labs Lab 09/30/15 1719  ALBUMIN 3.7    Gastrointestinal Profile: Last BM:  unknown   Nutrition-Focused Physical Exam Findings: Nutrition-Focused physical exam completed. Findings are WDL for fat depletion, muscle depletion, and edema, however RD unable to assess lower extremities on visit today.   Weight Change: Per MST pt did not know weight trend on admission. Pt unsure of weight on visit.   Height:   Ht Readings from Last 1 Encounters:  09/30/15 5\' 1"  (1.549 m)    Weight:   Wt Readings from Last 1 Encounters:  09/30/15 131 lb 8 oz (59.648 kg)     BMI:  Body mass index is 24.86 kg/(m^2).  Estimated Nutritional Needs:   Kcal:  BEE: 944kcals, TEE: (IF 1.1-1.3)(AF 1.2) 1246-1471kcals  Protein:  48-60g protein (0.8-1.0g/kg)  Fluid:  1490-175388mL of fluid (25-2930mL/kg)  EDUCATION NEEDS:   Education needs no appropriate at this time   MODERATE Care Level  Leda QuailAllyson Nyella Eckels, RD, LDN Pager [phone]

## 2015-10-02 ENCOUNTER — Inpatient Hospital Stay: Payer: Medicare Other

## 2015-10-02 DIAGNOSIS — S7222XA Displaced subtrochanteric fracture of left femur, initial encounter for closed fracture: Secondary | ICD-10-CM | POA: Insufficient documentation

## 2015-10-02 LAB — BASIC METABOLIC PANEL
Anion gap: 6 (ref 5–15)
BUN: 32 mg/dL — ABNORMAL HIGH (ref 6–20)
CHLORIDE: 106 mmol/L (ref 101–111)
CO2: 31 mmol/L (ref 22–32)
CREATININE: 1.88 mg/dL — AB (ref 0.44–1.00)
Calcium: 8.9 mg/dL (ref 8.9–10.3)
GFR calc non Af Amer: 22 mL/min — ABNORMAL LOW (ref >60)
GFR, EST AFRICAN AMERICAN: 26 mL/min — AB (ref >60)
Glucose, Bld: 128 mg/dL — ABNORMAL HIGH (ref 65–99)
POTASSIUM: 4.5 mmol/L (ref 3.5–5.1)
Sodium: 143 mmol/L (ref 135–145)

## 2015-10-02 LAB — CBC
HEMATOCRIT: 31.5 % — AB (ref 35.0–47.0)
HEMOGLOBIN: 10 g/dL — AB (ref 12.0–16.0)
MCH: 30.1 pg (ref 26.0–34.0)
MCHC: 31.7 g/dL — AB (ref 32.0–36.0)
MCV: 94.9 fL (ref 80.0–100.0)
Platelets: 182 10*3/uL (ref 150–440)
RBC: 3.32 MIL/uL — AB (ref 3.80–5.20)
RDW: 16.1 % — ABNORMAL HIGH (ref 11.5–14.5)
WBC: 11.1 10*3/uL — ABNORMAL HIGH (ref 3.6–11.0)

## 2015-10-02 LAB — GLUCOSE, CAPILLARY
GLUCOSE-CAPILLARY: 175 mg/dL — AB (ref 65–99)
GLUCOSE-CAPILLARY: 238 mg/dL — AB (ref 65–99)
Glucose-Capillary: 134 mg/dL — ABNORMAL HIGH (ref 65–99)
Glucose-Capillary: 145 mg/dL — ABNORMAL HIGH (ref 65–99)
Glucose-Capillary: 139 mg/dL — ABNORMAL HIGH (ref 65–99)
Glucose-Capillary: 62 mg/dL — ABNORMAL LOW (ref 65–99)

## 2015-10-02 MED ORDER — ISOSORBIDE MONONITRATE ER 30 MG PO TB24
30.0000 mg | ORAL_TABLET | Freq: Two times a day (BID) | ORAL | Status: DC
  Administered 2015-10-02 – 2015-10-03 (×2): 30 mg via ORAL
  Filled 2015-10-02 (×2): qty 1

## 2015-10-02 MED ORDER — BISACODYL 10 MG RE SUPP
10.0000 mg | Freq: Every day | RECTAL | Status: DC | PRN
  Administered 2015-10-02: 10 mg via RECTAL
  Filled 2015-10-02: qty 1

## 2015-10-02 NOTE — Progress Notes (Signed)
Per RN patient will D/C tomorrow. [facility] is long term care with palliative to continue to follow. Daughter is aware of above. Kim admissions [facility] is aware of above. Clinical Social Worker (CSW) will continue to follow and assist as needed.   Jetta LoutBailey Morgan, LCSWA [phone]

## 2015-10-02 NOTE — Progress Notes (Signed)
Patient's blood sugar at 1645- 62, given apple juice to drink, Spoke with Dr.Kalisetti - order received to hold evening novolog mix 70/30- 12 units.

## 2015-10-02 NOTE — Progress Notes (Addendum)
[facility] [place] at [facility]   PATIENT NAME: Patricia Millmanntoinette Hare    MR#:  [id number]  DATE OF BIRTH:  November 17, 1921  SUBJECTIVE:  CHIEF COMPLAINT:   Chief Complaint  Patient presents with  . Fall   -Patient is a [facility]-term [facility] of [facility]. Has significant dementia.  - sounds congested, complains of left hip pain from the fracture  REVIEW OF SYSTEMS:  Review of Systems  Unable to perform ROS: dementia    DRUG ALLERGIES:   Allergies  Allergen Reactions  . Ace Inhibitors Other (See Comments)    Reaction:  Unknown   . Clonidine Derivatives Other (See Comments)    Reaction:  Unknown   . Hytrin [Terazosin] Other (See Comments)    Reaction:  Unknown   . Metformin And Related Diarrhea  . Minoxidil Other (See Comments)    Reaction:  Unknown   . Vitamin D Analogs Other (See Comments)    Reaction:  Unknown     VITALS:  Blood pressure 176/77, pulse 77, temperature 97.6 F (36.4 C), temperature source Oral, resp. rate 22, height 5\' 1"  (1.549 m), weight 59.648 kg (131 lb 8 oz), SpO2 94 %.  PHYSICAL EXAMINATION:  Physical Exam  GENERAL:  81 y.o.-year-old patient lying in the bed with no acute distress.  EYES: Left Pupil round, reactive to light and accommodation. Significant right eye ptosis from previous craniotomy and surgery for brain tumor. No scleral icterus. Extraocular muscles intact.  HEENT: Head atraumatic, right-sided facial droop and ptosis of right eye from previous brain surgery and craniotomy.  Oropharynx and nasopharynx clear.  NECK:  Supple, no jugular venous distention. No thyroid enlargement, no tenderness.  LUNGS: no wheezing, rales,rhonchi or crepitation. No use of accessory muscles of respiration. Coarse breath sounds bilaterally, decreased at the bases. CARDIOVASCULAR: S1, S2 normal. No rubs, or gallops. 3/6 systolic murmur present ABDOMEN: Soft, nontender, nondistended. Bowel sounds present. No organomegaly or mass.   EXTREMITIES: No pedal edema, cyanosis, or clubbing. Right lower extremity is internally rotated. NEUROLOGIC: Right facial droop noted which is chronic. Not following commands due to her dementia. Sensation seems to intact. Able to move both upper extremities. Gait not checked.  PSYCHIATRIC: The patient is alert and oriented to self.  SKIN: No obvious rash, lesion, or ulcer.    LABORATORY PANEL:   CBC  Recent Labs Lab 10/02/15 0549  WBC 11.1*  HGB 10.0*  HCT 31.5*  PLT 182   ------------------------------------------------------------------------------------------------------------------  Chemistries   Recent Labs Lab 09/30/15 1719 10/02/15 0549  NA 142 143  K 4.2 4.5  CL 101 106  CO2 34* 31  GLUCOSE 216* 128*  BUN 29* 32*  CREATININE 1.80* 1.88*  CALCIUM 9.2 8.9  AST 20  --   ALT 13*  --   ALKPHOS 64  --   BILITOT 0.4  --    ------------------------------------------------------------------------------------------------------------------  Cardiac Enzymes  Recent Labs Lab 10/01/15 0947  TROPONINI 0.09*   ------------------------------------------------------------------------------------------------------------------  RADIOLOGY:  Dg Chest Port 1 View  10/02/2015  CLINICAL DATA:  CHF, history of COPD, diabetes, previous smoker EXAM: PORTABLE CHEST 1 VIEW COMPARISON:  Portable chest x-ray of September 30, 2015 FINDINGS: The lungs remain hyperinflated. The pulmonary interstitial markings remain increased. The pulmonary vascularity is slightly less engorged. The cardiac silhouette remains enlarged. A trace of pleural fluid persists on the left. The bony thorax exhibits no acute abnormality. IMPRESSION: CHF superimposed upon COPD. Slight interval improvement in pulmonary interstitial edema. Electronically Signed   By:  David  [place] M.D.   On: 10/02/2015 07:36   Dg Chest Portable 1 View  09/30/2015  CLINICAL DATA:  Preop left hip fracture EXAM: PORTABLE CHEST 1 VIEW  COMPARISON:  07/05/2014 FINDINGS: There is diffuse interstitial thickening. There is no focal parenchymal opacity. There is no pleural effusion or pneumothorax. There is stable cardiomegaly. There is thoracic aortic atherosclerosis. There are multiple old right rib fractures. IMPRESSION: Cardiomegaly with mild pulmonary vascular congestion. Electronically Signed   By: Elige Ko   On: 09/30/2015 19:15   Dg Hips Bilat With Pelvis 2v  09/30/2015  CLINICAL DATA:  Status post fall. EXAM: DG HIP (WITH OR WITHOUT PELVIS) 2V BILAT COMPARISON:  07/05/2014 FINDINGS: There is generalized osteopenia. There are bilateral hip arthroplasties. There is old posttraumatic deformity of the right superior and inferior pubic rami. There is a nondisplaced fracture of the proximal left femur just inferior to the greater trochanter. There is no other acute fracture or dislocation. There is no lytic or sclerotic osseous lesion. There is peripheral vascular atherosclerotic disease. IMPRESSION: 1. Nondisplaced fracture of the proximal left femur just inferior to the greater trochanter. 2.  No acute osseous injury of the right hip. Electronically Signed   By: Elige Ko   On: 09/30/2015 17:54    EKG:   Orders placed or performed during the hospital encounter of 09/30/15  . EKG 12-Lead  . EKG 12-Lead    ASSESSMENT AND PLAN:   81 year old female with past medical history significant for history of dementia, chronic right facial droop due to previous brain surgery, COPD, congestive heart failure, CK D, hypertension presents to the hospital after a fall and noted to have left hip fracture.  #1 Left femur fracture-family opted for nonoperative management at this time. -Appreciate Ortho consult. Pain control is essential. Palliative care consult after discharge as well. -Anxiety medications added as well - optimal pain control  #2 dysphagia-speech therapy consult and nectar thick liquids  #3 Congestive heart  failure-chest x-ray with chronic fibrosis changes and no acute exacerbation of CHF. -Clinically patient has productive cough, though according to daughter its chronic. -Continue torsemide. -Also speech therapy consulted- change diet to dysphagia with nectar thick  #4 diabetes mellitus-Patient on 70/30 NovoLog mix twice a day, will continue that. Also on sliding scale insulin.  #5 hypertension-continue Imdur and losartan -elevated BP, increased imdur dose today - hydralazine IV prn  #6 depression and dementia and seems to be at baseline. Continue her own medications-on Effexor, Lamictal  #7 DVT prophylaxis-on subcutaneous heparin  Plan is to discharge back to [facility] with palliative care services  All the records are reviewed and case discussed with Care Management/Social Workerr. Management plans discussed with the patient, family and they are in agreement.  CODE STATUS: DO NOT RESUSCITATE  TOTAL TIME TAKING CARE OF THIS PATIENT: 36 minutes.   POSSIBLE D/C TOMORROW, DEPENDING ON CLINICAL CONDITION.   Felix Meras M.D on 10/02/2015 at 12:50 PM  Between 7am to 6pm - Pager - [phone]  After 6pm go to [email or web address] - password [email or web address] [facility]  [place] [facility]  Office  [phone]  CC: Primary care physician; Lauro Regulus., MD

## 2015-10-02 NOTE — Evaluation (Signed)
Clinical/Bedside Swallow Evaluation Patient Details  Name: Patricia Cameron MRN: [id number] Date of Birth: [birth year]  Today's Date: 10/02/2015 Time: SLP Start Time (ACUTE ONLY): 1035 SLP Stop Time (ACUTE ONLY): 1135 SLP Time Calculation (min) (ACUTE ONLY): 60 min  Past Medical History:  Past Medical History  Diagnosis Date  . COPD (chronic obstructive pulmonary disease) (HCC)   . Dementia   . CHF (congestive heart failure) (HCC)   . Gout   . OSA (obstructive sleep apnea)   . Diabetes mellitus without complication (HCC)   . Macular degeneration   . Bell's palsy   . CKD (chronic kidney disease)   . Thyroid disease     hypothyroidism  . GERD (gastroesophageal reflux disease)   . Hyperlipidemia   . Depression   . Myocardial infarction, old    Past Surgical History: History reviewed. No pertinent past surgical history. HPI:  Pt is a 81 y.o. female for goals of medical therapy in patient with a left hip fracture following an unwitnessed fall at [facility]. It is unclear as to what level of care she is currently under at the facility but she appears to be at long term care level of care (She has Medicare and Tricare for [employer]). She has dementia that is advanced and was supposed to be nonweightbearing but decided to ambulate and fell. Orthopedic surgeon spoke with family and they desided on a more palliative approach. Pt has a h/o a benign brain tumor w/ surgery w/ resulting R facial and labial waekness; eye is more closed though she does blink. Pt has lengthy medical history including GERD, DM, HTN, etc. no surgery to be done but palliative approach is chosen due to patient's advanced dementia and nonambulatory status at baseline. NSG reported concern for coughing w/ liquids yesterday when drinking - staff member noted pt was more sleepy yesterday however.    Assessment / Plan / Recommendation Clinical Impression  Pt appears to adequately tolerate trials of thin liquids and  purees/soft solids w/ no immediate coughing or other s/s of aspiration noted when following strict aspiration precautions including fully upright in the bed and using small, single sips w/ standard straw (NOT the large jug straw). Pt exhibited no changes in respiratory status or wet vocal quality b/t trials, however, did have a mild cough (delayed) x2 w/ thin liquids; this cough was similar to pt's cough when moving about in the bed PRIOR to the BSE and po trials. Pt does have baseline decreased labial ROM and strength on R side which impacts opening orally for po trials; no labial loss of bolus material. Due to pt's presentation at this time, rec. a mech soft diet w/ thin liquids w/ Strict aspiration precautions and feeding assistance as nec. ST will f/u w/ toleration of diet; rec. Meds in Puree. NSG updated.      Aspiration Risk   (mildly increased)    Diet Recommendation Dysphagia 3 (Mech soft);Thin   Medication Administration: Whole meds with puree Compensations: Minimize environmental distractions;Slow rate;Small sips/bites;Check for pocketing (R side as Arther Damesnec.)    Other  Recommendations Recommended Consults:  (Dietician) Oral Care Recommendations: Oral care BID;Staff/trained caregiver to provide oral care   Follow Up Recommendations       Frequency and Duration min 3x week  1 week   Pertinent Vitals/Pain denied    SLP Swallow Goals  see care plan   Swallow Study Prior Functional Status   [facility]; was eating a regular diet per chart.  General Date of Onset: 09/30/15 Other Pertinent Information: Pt is a 81 y.o. female for goals of medical therapy in patient with a left hip fracture following an unwitnessed fall at [facility]. It is unclear as to what level of care she is currently under at the facility but she appears to be at long term care level of care (She has Medicare and T[employer]are for [employer]). She has dementia that is advanced and was supposed to be  nonweightbearing but decided to ambulate and fell. Orthopedic surgeon spoke with family and they desided on a more palliative approach. Pt has a h/o a benign brain tumor w/ surgery w/ resulting R facial and labial waekness; eye is more closed though she does blink. Pt has lengthy medical history including GERD, DM, HTN, etc. no surgery to be done but palliative approach is chosen due to patient's advanced dementia and nonambulatory status at baseline. NSG reported concern for coughing w/ liquids yesterday when drinking - staff member noted pt was more sleepy yesterday however.  Type of Study: Bedside swallow evaluation Previous Swallow Assessment: none indicated Diet Prior to this Study: Regular;Thin liquids (per report) Temperature Spikes Noted: No (wbc slightly elevated) Respiratory Status: Supplemental O2 delivered via (comment) ([place] 2 liters; CHF superimposed upon COPD per CXR.Marland Kitchen ) History of Recent Intubation: No Behavior/Cognition: Alert;Cooperative;Confused;Distractible;Requires cueing Oral Cavity - Dentition: Adequate natural dentition/normal for age (appeared) Self-Feeding Abilities: Needs assist;Needs set up Patient Positioning: Upright in bed Baseline Vocal Quality: Low vocal intensity (mumbled speech) Volitional Cough: Cognitively unable to elicit Volitional Swallow: Able to elicit    Oral/Motor/Sensory Function Overall Oral Motor/Sensory Function: Impaired Labial ROM: Reduced right Labial Symmetry: Abnormal symmetry right Labial Strength: Reduced Lingual ROM: Within Functional Limits (grossly) Lingual Symmetry: Within Functional Limits Lingual Strength: Within Functional Limits Mandible: Within Functional Limits   Ice Chips Ice chips: Within functional limits Presentation: Spoon (fed; 3 trials)   Thin Liquid Thin Liquid: Within functional limits (grossly) Presentation: Self Fed;Straw (assisted; 12 trials) Other Comments: pt exhibited a slight, mild cough x2 between trials of  thi liquids; this did not appear directly related to the po trials taken and similar to the cough noted when she moved about in the bed for upright positioning.     Nectar Thick Nectar Thick Liquid: Not tested   Honey Thick Honey Thick Liquid: Not tested   Puree Puree: Within functional limits Presentation: Spoon (fed; 6 trials)   Solid   GO    Solid: Within functional limits Presentation: Spoon (fed; 3 trials)      Jerilynn Som, MS, CCC-SLP  Watson,Katherine 10/02/2015,2:09 PM

## 2015-10-02 NOTE — Care Management Important Message (Signed)
Important Message  Patient Details  Name: Nathanial Millmanntoinette Mickelson MRN: [id number] Date of Birth: 06-16-1921   Medicare Important Message Given:  Yes-second notification given    Olegario MessierKathy A Allmond 10/02/2015, 11:01 AM

## 2015-10-02 NOTE — Progress Notes (Signed)
Palliative Medicine Inpatient Consult Follow Up Note   Name: Patricia Cameron Date: 10/02/2015 MRN: [id number]  DOB: [birth year]  Referring Physician: Enid Baas, MD  Palliative Care consult requested for this 81 y.o. female for goals of medical therapy in patient with a left hip fracture following an unwitnessed fall at [facility]. It is unclear as to what level of care she is currently under at the facility but she appears to be at long term care level of care (She has Medicare and [employer]are for [employer]). She has dementia that is advanced and was supposed to be nonweightbearing but decided to ambulate and fell. Orthopedic surgeon spoke with family and they desided on a more palliative approach. She also has an incidental finding of a minimally elevated troponin at 0.09. She is currently unresponsive and no family is in room, so I will be calling them. Pt s BP is 103/40 with pulse 57 and resp 16 and temp 98.8.   TODAY'S DISCUSSIONS AND DECISIONS: 1. She remains DNR with portable DNR in the paper chart. 2. She is congested some and hospitalist is keeping her here tonite b/c of this. 3. We now know pt is ALREADY followed by PALLIATIVE CARE at [facility] facility. This means pt can easily be admitted to Hospice Care there if such becomes appropriate. At this time, unless pt develops a new lasting problem, she does not seem to be ready for Hospice. Discussed with Hospice Liaison.   4.  Pt cannot have ambulation therapy --at least not until the linear hip fracture heals or stabilizes further. She is not felt to be safe walking at baseline due to her imbalance problems related to her past brain surgery for a benign tumore.  She will presumably go back as a long term care pt.  5.  I will sign off at this time. Pt can easily go back to facility with ongoing Palliative Care consult.   IMPRESSION: 1. Left Hip Fracture ---due to mechanical fall ---no surgery to be done but palliative  approach is chosen due to patient's advanced dementia and nonambulatory status at baseline. 2. DM-2 3. Essential HTN 4. GERD 5. Prevention of clots --on heparin SQ 6. Elevated troponin due to demand ischemia most likely 7. Very recent UTI 10. Advanced Age 60. Moderate Dementia ---with delusional disorder 10. H/o Right hip fx 11. Very hard of hearing --worse on right side 12. COPD (Former smoker) 13. History of systolic CHF 14. History of Brain tumor with surgery ---with sequelae of right sided facial droop  15. Macular Degeneration 16. Hypothyroidism 17. Dyslipidemia 18. Depression 19. CAD with h/o MI 20. Gout 21. Seasonal Allergies 22. On Lamictal (unsure what this is treating) 23. Hemorrhoids 24. 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 Cardiomegaly with mild pulmonary vascular congestion on adm CXR 26. PVD27. Constipation 27. Incontinent of urine    REVIEW OF SYSTEMS:  Patient is not able to provide ROS today --not really talking to me though she does talk reportedly  CODE STATUS: DNR   PAST MEDICAL HISTORY: Past Medical History  Diagnosis Date  . COPD (chronic obstructive pulmonary disease) (HCC)   . Dementia   . CHF (congestive heart failure) (HCC)   . Gout   . OSA (obstructive sleep apnea)   . Diabetes mellitus without complication (HCC)   . Macular degeneration   . Bell's palsy   . CKD (chronic kidney disease)   . Thyroid disease  hypothyroidism  . GERD (gastroesophageal reflux disease)   . Hyperlipidemia   . Depression   . Myocardial infarction, old     PAST SURGICAL HISTORY: History reviewed. No pertinent past surgical history.  Vital Signs: BP 176/77 mmHg  Pulse 77  Temp(Src) 97.6 F (36.4 C) (Oral)  Resp 22  Ht 5\' 1"  (1.549 m)  Wt 59.648 kg (131 lb 8 oz)  BMI 24.86 kg/m2  SpO2 94% Filed Weights   09/30/15 1655 09/30/15 2248  Weight: 59.194 kg (130  lb 8 oz) 59.648 kg (131 lb 8 oz)    Estimated body mass index is 24.86 kg/(m^2) as calculated from the following:   Height as of this encounter: 5\' 1"  (1.549 m).   Weight as of this encounter: 59.648 kg (131 lb 8 oz).  PHYSICAL EXAM: NAD EOMI Right sided facial weakness that is old (from benign brain tumor surgery yrs ago) OP clear No JVD or Tm Heart rrr no mgr Lungs cta Abd soft and NT Ext not moved due to left 'nondisplaced and nonsurgical fracture of proximal left femur' Skin warm and dry  LABS: CBC:    Component Value Date/Time   WBC 11.1* 10/02/2015 0549   WBC 10.4 01/07/2015 0740   HGB 10.0* 10/02/2015 0549   HGB 10.0* 01/07/2015 0740   HCT 31.5* 10/02/2015 0549   HCT 31.5* 01/07/2015 0740   PLT 182 10/02/2015 0549   PLT 290 01/07/2015 0740   MCV 94.9 10/02/2015 0549   MCV 99 01/07/2015 0740   NEUTROABS 6.0 09/30/2015 1719   NEUTROABS 5.7 01/07/2015 0740   LYMPHSABS 1.3 09/30/2015 1719   LYMPHSABS 3.0 01/07/2015 0740   MONOABS 0.6 09/30/2015 1719   MONOABS 0.9 01/07/2015 0740   EOSABS 0.3 09/30/2015 1719   EOSABS 0.7 01/07/2015 0740   BASOSABS 0.0 09/30/2015 1719   BASOSABS 0.1 01/07/2015 0740   Comprehensive Metabolic Panel:    Component Value Date/Time   NA 143 10/02/2015 0549   NA 139 01/07/2015 0740   K 4.5 10/02/2015 0549   K 5.3* 01/07/2015 0740   CL 106 10/02/2015 0549   CL 107 01/07/2015 0740   CO2 31 10/02/2015 0549   CO2 26 01/07/2015 0740   BUN 32* 10/02/2015 0549   BUN 75* 01/07/2015 0740   CREATININE 1.88* 10/02/2015 0549   CREATININE 2.84* 01/07/2015 0740   GLUCOSE 128* 10/02/2015 0549   GLUCOSE 123* 01/07/2015 0740   CALCIUM 8.9 10/02/2015 0549   CALCIUM 8.6 01/07/2015 0740   AST 20 09/30/2015 1719   AST 17 01/07/2015 0740   ALT 13* 09/30/2015 1719   ALT 15 01/07/2015 0740   ALKPHOS 64 09/30/2015 1719   ALKPHOS 86 01/07/2015 0740   BILITOT 0.4 09/30/2015 1719   BILITOT 0.3 01/07/2015 0740   PROT 6.8 09/30/2015 1719   PROT 6.8  01/07/2015 0740   ALBUMIN 3.7 09/30/2015 1719   ALBUMIN 3.4 01/07/2015 0740    More than 50% of the visit was spent in counseling/coordination of care: YES  Time Spent:  25 min

## 2015-10-03 LAB — GLUCOSE, CAPILLARY
GLUCOSE-CAPILLARY: 138 mg/dL — AB (ref 65–99)
GLUCOSE-CAPILLARY: 68 mg/dL (ref 65–99)
Glucose-Capillary: 163 mg/dL — ABNORMAL HIGH (ref 65–99)

## 2015-10-03 MED ORDER — ALPRAZOLAM 0.25 MG PO TABS: 0.2500 mg | ORAL_TABLET | Freq: Three times a day (TID) | ORAL | Status: AC | PRN

## 2015-10-03 MED ORDER — OXYCODONE HCL 5 MG PO TABS: 5.0000 mg | ORAL_TABLET | Freq: Four times a day (QID) | ORAL | Status: AC | PRN

## 2015-10-03 MED ORDER — ISOSORBIDE MONONITRATE ER 30 MG PO TB24: 30.0000 mg | ORAL_TABLET | Freq: Two times a day (BID) | ORAL | Status: AC

## 2015-10-03 MED ORDER — INSULIN ASPART PROT & ASPART (70-30 MIX) 100 UNIT/ML ~~LOC~~ SUSP: 10.0000 [IU] | Freq: Two times a day (BID) | SUBCUTANEOUS | Status: AC

## 2015-10-03 MED ORDER — TRAMADOL HCL 50 MG PO TABS: 50.0000 mg | ORAL_TABLET | Freq: Four times a day (QID) | ORAL | Status: AC | PRN

## 2015-10-03 NOTE — Progress Notes (Signed)
Patient is medically stable for D/C back to [facility] today. Per Kim admissions coordinator at [facility] patient is going to room 302-A. RN will call report at [phone] and arrange EMS for transport. Clinical Child psychotherapistocial Worker (CSW) prepared D/C packet and sent D/C Summary via carefinder. Patient is aware of above. CSW contacted patient's daughter Thayer OhmChris and made her aware of above. Please reconsult if future social work needs arise. CSW signing off.   Jetta LoutBailey Morgan, LCSWA [phone]

## 2015-10-03 NOTE — Progress Notes (Signed)
Pt. Pleasantly confused. VSS. Pain controlled with meds per MAR. Turned q2h. Incontinent of bowel and bladder. Dysphagia diet with nector thick liquids. Meds crushed with applesauce. Resting quietly.

## 2015-10-03 NOTE — Discharge Summary (Signed)
[facility]   PATIENT NAME: Patricia Cameron    MR#:  [id number]  DATE OF BIRTH:  09-15-1921  DATE OF ADMISSION:  09/30/2015 ADMITTING PHYSICIAN: Wyatt Haste, MD  DATE OF DISCHARGE: 10/03/2015  PRIMARY CARE PHYSICIAN: Lauro Regulus., MD    ADMISSION DIAGNOSIS:  Elevated troponin I level [R79.89] Closed left subtrochanteric femur fracture, initial encounter (HCC) [S72.22XA]  DISCHARGE DIAGNOSIS:  Active Problems:   Femur fracture, left (HCC)   Elevated troponin I level   Closed left subtrochanteric femur fracture (HCC)   SECONDARY DIAGNOSIS:   Past Medical History  Diagnosis Date  . COPD (chronic obstructive pulmonary disease) (HCC)   . Dementia   . CHF (congestive heart failure) (HCC)   . Gout   . OSA (obstructive sleep apnea)   . Diabetes mellitus without complication (HCC)   . Macular degeneration   . Bell's palsy   . CKD (chronic kidney disease)   . Thyroid disease     hypothyroidism  . GERD (gastroesophageal reflux disease)   . Hyperlipidemia   . Depression   . 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  #1 Left femur fracture-family opted for nonoperative management at this time. -Appreciate Ortho consult. Pain control is essential. Palliative care consult after discharge as well. -Anxiety medications added as well - optimal pain control. Weight bearing as tolerated  #2 dysphagia-speech therapy consult and nectar thick liquids  #3 Congestive heart failure-chest x-ray with chronic fibrosis changes and no acute exacerbation of CHF. -Clinically patient has productive cough, though according to daughter its chronic. -Continue  torsemide. -dysphagia diet with thin liquids and aspiration precautions  #4 diabetes mellitus-Patient on 70/30 NovoLog mix twice a day, sugars have been on the lower side- may be poor po intake - decrease dose to 10units bid  #5 hypertension-continue Imdur and losartan - increased imdur dose to bid   #6 depression and dementia and seems to be at baseline. Continue her own medications-on Effexor, Lamictal  Plan is to discharge back to [facility] with palliative care services  DISCHARGE CONDITIONS:   Guarded  CONSULTS OBTAINED:  Treatment Team:  Wyatt Haste, MD  DRUG ALLERGIES:   Allergies  Allergen Reactions  . Ace Inhibitors Other (See Comments)    Reaction:  Unknown   . Clonidine Derivatives Other (See Comments)    Reaction:  Unknown   . Hytrin [Terazosin] Other (See Comments)    Reaction:  Unknown   . Metformin And Related Diarrhea  . Minoxidil Other (See Comments)    Reaction:  Unknown   . Vitamin D Analogs Other (See Comments)    Reaction:  Unknown     DISCHARGE MEDICATIONS:   Current Discharge Medication List    START taking these medications   Details  ALPRAZolam (XANAX) 0.25 MG tablet Take 1 tablet (0.25 mg total) by mouth 3 (three) times daily as needed for anxiety. Qty: 20 tablet, Refills: 0    oxyCODONE (OXY IR/ROXICODONE) 5 MG immediate release tablet Take 1-2 tablets (5-10 mg total) by mouth every 6 (six) hours as needed for severe pain. Qty: 25 tablet, Refills: 0    traMADol (ULTRAM) 50 MG tablet Take 1 tablet (50 mg total) by mouth  every 6 (six) hours as needed for moderate pain. Qty: 20 tablet, Refills: 0      CONTINUE these medications which have CHANGED   Details  insulin aspart protamine- aspart (NOVOLOG MIX 70/30) (70-30) 100 UNIT/ML injection Inject 0.1 mLs (10 Units total) into the skin 2 (two) times daily. Pt uses 16 units in the morning and 12 units in the evening. Qty: 10 mL, Refills: 11    isosorbide mononitrate (IMDUR) 30 MG 24 hr  tablet Take 1 tablet (30 mg total) by mouth 2 (two) times daily. Qty: 60 tablet, Refills: 0      CONTINUE these medications which have NOT CHANGED   Details  acetaminophen (TYLENOL) 325 MG tablet Take 650 mg by mouth every 4 (four) hours as needed for mild pain or fever.    allopurinol (ZYLOPRIM) 100 MG tablet Take 100 mg by mouth daily.    alum & mag hydroxide-simeth (MAALOX PLUS) 400-400-40 MG/5ML suspension Take 30 mLs by mouth every 4 (four) hours as needed for indigestion.    aspirin 81 MG chewable tablet Chew 81 mg by mouth daily.    Cholecalciferol (VITAMIN D) 2000 UNITS CAPS Take 2,000 Units by mouth daily.    cholestyramine (QUESTRAN) 4 G packet Take 2 g by mouth every 12 (twelve) hours as needed (for diarrhea).    docusate calcium (SURFAK) 240 MG capsule Take 240 mg by mouth at bedtime as needed for mild constipation.    Ferrous Sulfate 140 (45 FE) MG TBCR Take 140 mg by mouth daily.    fexofenadine (ALLEGRA) 60 MG tablet Take 60 mg by mouth daily.    glucagon 1 MG injection Inject 1 mg into the vein once as needed (for FSBS <60).    guaiFENesin (MUCINEX) 600 MG 12 hr tablet Take 600 mg by mouth 2 (two) times daily.    guaiFENesin (ROBITUSSIN) 100 MG/5ML SOLN Take 10 mLs by mouth every 4 (four) hours as needed for cough.    Ipratropium-Albuterol (COMBIVENT RESPIMAT) 20-100 MCG/ACT AERS respimat Inhale 1 puff into the lungs 4 (four) times daily.    ipratropium-albuterol (DUONEB) 0.5-2.5 (3) MG/3ML SOLN Take 3 mLs by nebulization every 6 (six) hours as needed (for shortness of breath).    lamoTRIgine (LAMICTAL) 100 MG tablet Take 50 mg by mouth every 12 (twelve) hours.    levothyroxine (SYNTHROID, LEVOTHROID) 75 MCG tablet Take 75 mcg by mouth daily.    loperamide (IMODIUM) 2 MG capsule Take 2-4 mg by mouth as needed for diarrhea or loose stools.    losartan (COZAAR) 100 MG tablet Take 100 mg by mouth daily.    menthol-cetylpyridinium (CEPACOL) 3 MG lozenge Take 1  lozenge by mouth as needed for sore throat.    methocarbamol (ROBAXIN) 500 MG tablet Take 500 mg by mouth every 6 (six) hours as needed for muscle spasms.    nitroGLYCERIN (NITROSTAT) 0.4 MG SL tablet Place 0.4 mg under the tongue every 5 (five) minutes as needed for chest pain.    pantoprazole (PROTONIX) 40 MG tablet Take 40 mg by mouth 2 (two) times daily.    phenylephrine-shark liver oil-mineral oil-petrolatum (PREPARATION H) 0.25-3-14-71.9 % rectal ointment Place 1 application rectally 4 (four) times daily as needed for hemorrhoids.    promethazine (PHENERGAN) 25 MG suppository Place 25 mg rectally every 4 (four) hours as needed for nausea or vomiting.    promethazine (PHENERGAN) 25 MG tablet Take 25 mg by mouth every 4 (four) hours as needed for nausea or vomiting.  promethazine (PHENERGAN) 25 MG/ML injection Inject 25 mg into the vein as needed for nausea or vomiting.    rOPINIRole (REQUIP) 0.25 MG tablet Take 0.25 mg by mouth at bedtime.    torsemide (DEMADEX) 10 MG tablet Take 10 mg by mouth daily.    venlafaxine XR (EFFEXOR-XR) 75 MG 24 hr capsule Take 75 mg by mouth daily.    vitamin B-12 (CYANOCOBALAMIN) 250 MCG tablet Take 250 mcg by mouth daily.         DISCHARGE INSTRUCTIONS:   1. PCP follow-up in 1 week 2. Palliative care follow-up at the rehabilitation 3. Weight bearing as tolerated to the left hip 4. Orthopedic follow-up in 2 weeks  If you experience worsening of your admission symptoms, develop shortness of breath, life threatening emergency, suicidal or homicidal thoughts you must seek medical attention immediately by calling 911 or calling your MD immediately  if symptoms less severe.  You Must read complete instructions/literature along with all the possible adverse reactions/side effects for all the Medicines you take and that have been prescribed to you. Take any new Medicines after you have completely understood and accept all the possible adverse  reactions/side effects.   Please note  You were cared for by a hospitalist during your hospital stay. If you have any questions about your discharge medications or the care you received while you were in the hospital after you are discharged, you can call the unit and asked to speak with the hospitalist on call if the hospitalist that took care of you is not available. Once you are discharged, your primary care physician will handle any further medical issues. Please note that NO REFILLS for any discharge medications will be authorized once you are discharged, as it is imperative that you return to your primary care physician (or establish a relationship with a primary care physician if you do not have one) for your aftercare needs so that they can reassess your need for medications and monitor your lab values.    Today   CHIEF COMPLAINT:   Chief Complaint  Patient presents with  . Fall    VITAL SIGNS:  Blood pressure 160/61, pulse 77, temperature 98.6 F (37 C), temperature source Oral, resp. rate 16, height 5\' 1"  (1.549 m), weight 59.648 kg (131 lb 8 oz), SpO2 100 %.  I/O:   Intake/Output Summary (Last 24 hours) at 10/03/15 1114 Last data filed at 10/03/15 0800  Gross per 24 hour  Intake    360 ml  Output      0 ml  Net    360 ml    PHYSICAL EXAMINATION:   Physical Exam  GENERAL: 81 y.o.-year-old patient lying in the bed with no acute distress.  EYES: Left Pupil round, reactive to light and accommodation. Significant right eye ptosis from previous craniotomy and surgery for brain tumor. No scleral icterus. Extraocular muscles intact.  HEENT: Head atraumatic, right-sided facial droop and ptosis of right eye from previous brain surgery and craniotomy. Oropharynx and nasopharynx clear.  NECK: Supple, no jugular venous distention. No thyroid enlargement, no tenderness.  LUNGS: no wheezing, rales,rhonchi or crepitation. No use of accessory muscles of respiration. Coarse  breath sounds bilaterally, decreased at the bases. CARDIOVASCULAR: S1, S2 normal. No rubs, or gallops. 3/6 systolic murmur present ABDOMEN: Soft, nontender, nondistended. Bowel sounds present. No organomegaly or mass.  EXTREMITIES: No pedal edema, cyanosis, or clubbing. Right lower extremity is internally rotated. NEUROLOGIC: Right facial droop noted which is chronic. Not following commands due  to her dementia. Sensation seems to intact. Able to move both upper extremities. Gait not checked.  PSYCHIATRIC: The patient is alert and oriented to self.  SKIN: No obvious rash, lesion, or ulcer.   DATA REVIEW:   CBC  Recent Labs Lab 10/02/15 0549  WBC 11.1*  HGB 10.0*  HCT 31.5*  PLT 182    Chemistries   Recent Labs Lab 09/30/15 1719 10/02/15 0549  NA 142 143  K 4.2 4.5  CL 101 106  CO2 34* 31  GLUCOSE 216* 128*  BUN 29* 32*  CREATININE 1.80* 1.88*  CALCIUM 9.2 8.9  AST 20  --   ALT 13*  --   ALKPHOS 64  --   BILITOT 0.4  --     Cardiac Enzymes  Recent Labs Lab 10/01/15 0947  TROPONINI 0.09*    Microbiology Results  Results for orders placed or performed during the hospital encounter of 09/30/15  MRSA PCR Screening     Status: None   Collection Time: 10/01/15  3:12 AM  Result Value Ref Range Status   MRSA by PCR NEGATIVE NEGATIVE Final    Comment:        The GeneXpert MRSA Assay (FDA approved for NASAL specimens only), is one component of a comprehensive MRSA colonization surveillance program. It is not intended to diagnose MRSA infection nor to guide or monitor treatment for MRSA infections.     RADIOLOGY:  Dg Chest Port 1 View  10/02/2015  CLINICAL DATA:  CHF, history of COPD, diabetes, previous smoker EXAM: PORTABLE CHEST 1 VIEW COMPARISON:  Portable chest x-ray of September 30, 2015 FINDINGS: The lungs remain hyperinflated. The pulmonary interstitial markings remain increased. The pulmonary vascularity is slightly less engorged. The cardiac  silhouette remains enlarged. A trace of pleural fluid persists on the left. The bony thorax exhibits no acute abnormality. IMPRESSION: CHF superimposed upon COPD. Slight interval improvement in pulmonary interstitial edema. Electronically Signed   By: David  Swazil[place] M.D.   On: 10/02/2015 07:36    EKG:   Orders placed or performed during the hospital encounter of 09/30/15  . EKG 12-Lead  . EKG 12-Lead      Management plans discussed with the patient, family and they are in agreement.  CODE STATUS:     Code Status Orders        Start     Ordered   09/30/15 2048  Do not attempt resuscitation (DNR)   Continuous    Question Answer Comment  In the event of cardiac or respiratory ARREST Do not call a "code blue"   In the event of cardiac or respiratory ARREST Do not perform Intubation, CPR, defibrillation or ACLS   In the event of cardiac or respiratory ARREST Use medication by any route, position, wound care, and other measures to relive pain and suffering. May use oxygen, suction and manual treatment of airway obstruction as needed for comfort.      09/30/15 2048    Advance Directive Documentation        Most Recent Value   Type of Advance Directive  Living will, Out of facility DNR (pink MOST or yellow form), Healthcare Power of Attorney   Pre-existing out of facility DNR order (yellow form or pink MOST form)  Yellow form placed in chart (order not valid for inpatient use)   "MOST" Form in Place?        TOTAL TIME TAKING CARE OF THIS PATIENT: 38 minutes.    Enid BaasKALISETTI,Aarushi Hemric M.D on 10/03/2015  at 11:14 AM  Between 7am to 6pm - Pager - [phone]  After 6pm go to [email or web address] - password [email or web address] [facility]  [facility]  Office  [phone]  CC: Primary care physician; Lauro Regulus., MD

## 2015-10-03 NOTE — Progress Notes (Signed)
[facility] IV removed. Patient's belongings packed.

## 2015-10-07 LAB — GLUCOSE, CAPILLARY: Glucose-Capillary: 93 mg/dL (ref 65–99)

## 2015-10-21 DEATH — deceased
